# Patient Record
Sex: Male | Born: 1965 | Race: White | Hispanic: No | Marital: Single | Smoking: Never smoker
Health system: Southern US, Community
[De-identification: ages and names within clinical notes are randomized; demographics above are authoritative.]

## PROBLEM LIST (undated history)

## (undated) DIAGNOSIS — R42 Dizziness and giddiness: Secondary | ICD-10-CM

## (undated) HISTORY — DX: Dizziness and giddiness: R42

---

## 2002-03-21 ENCOUNTER — Emergency Department (HOSPITAL_COMMUNITY): Admission: EM | Admit: 2002-03-21 | Discharge: 2002-03-21 | Payer: Self-pay | Admitting: *Deleted

## 2003-09-14 ENCOUNTER — Emergency Department (HOSPITAL_COMMUNITY): Admission: EM | Admit: 2003-09-14 | Discharge: 2003-09-14 | Payer: Self-pay | Admitting: *Deleted

## 2003-12-21 ENCOUNTER — Emergency Department (HOSPITAL_COMMUNITY): Admission: EM | Admit: 2003-12-21 | Discharge: 2003-12-21 | Payer: Self-pay | Admitting: Emergency Medicine

## 2005-03-19 ENCOUNTER — Emergency Department (HOSPITAL_COMMUNITY): Admission: EM | Admit: 2005-03-19 | Discharge: 2005-03-19 | Payer: Self-pay | Admitting: Emergency Medicine

## 2012-08-28 ENCOUNTER — Emergency Department (HOSPITAL_COMMUNITY): Payer: Self-pay

## 2012-08-28 ENCOUNTER — Emergency Department (HOSPITAL_COMMUNITY)
Admission: EM | Admit: 2012-08-28 | Discharge: 2012-08-28 | Disposition: A | Discharge status: Home / Self Care | Payer: Self-pay | Attending: Emergency Medicine | Admitting: Emergency Medicine

## 2012-08-28 ENCOUNTER — Encounter (HOSPITAL_COMMUNITY): Payer: Self-pay | Admitting: *Deleted

## 2012-08-28 DIAGNOSIS — R079 Chest pain, unspecified: Secondary | ICD-10-CM | POA: Insufficient documentation

## 2012-08-28 LAB — BASIC METABOLIC PANEL WITH GFR
BUN: 10 mg/dL (ref 6–23)
CO2: 29 meq/L (ref 19–32)
Calcium: 9.7 mg/dL (ref 8.4–10.5)
Chloride: 101 meq/L (ref 96–112)
Creatinine, Ser: 0.81 mg/dL (ref 0.50–1.35)
GFR calc Af Amer: >90 mL/min
GFR calc non Af Amer: >90 mL/min
Glucose, Bld: 87 mg/dL (ref 70–99)
Potassium: 4 meq/L (ref 3.5–5.1)
Sodium: 139 meq/L (ref 135–145)

## 2012-08-28 LAB — TROPONIN I
Troponin I: <0.3 ng/mL
Troponin I: <0.3 ng/mL

## 2012-08-28 LAB — CBC
HCT: 42.6 % (ref 39.0–52.0)
Hemoglobin: 15.4 g/dL (ref 13.0–17.0)
MCH: 31.3 pg (ref 26.0–34.0)
MCHC: 36.2 g/dL — ABNORMAL HIGH (ref 30.0–36.0)
MCV: 86.6 fL (ref 78.0–100.0)
Platelets: 190 K/uL (ref 150–400)
RBC: 4.92 MIL/uL (ref 4.22–5.81)
RDW: 12.4 % (ref 11.5–15.5)
WBC: 7.2 K/uL (ref 4.0–10.5)

## 2012-08-28 MED ORDER — ASPIRIN 81 MG PO CHEW
324.0000 mg | CHEWABLE_TABLET | Freq: Once | ORAL | Status: AC
  Administered 2012-08-28: 324 mg via ORAL
  Filled 2012-08-28: qty 4

## 2012-08-28 NOTE — ED Notes (Signed)
Chest pain for 2 hours, "light headed"

## 2012-08-28 NOTE — ED Notes (Signed)
Patient with no complaints at this time. Respirations even and unlabored. Skin warm/dry. Discharge instructions reviewed with patient at this time. Patient given opportunity to voice concerns/ask questions. IV removed per policy and band-aid applied to site. Patient discharged at this time and left Emergency Department with steady gait.  

## 2012-08-28 NOTE — ED Provider Notes (Signed)
History     CSN: [id number]  Arrival date & time 08/28/12  1353   First MD Initiated Contact with Patient 08/28/12 1629      Chief Complaint  Patient presents with  . 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 States he had SOB at the time, pain was non-radiating and gradually improving. No pain at present. He denies any prior history of same. No known CAD, HTN, DM or HLD.  History reviewed. No pertinent past medical history.  History reviewed. No pertinent past surgical history.  History reviewed. No pertinent family history.  History  Substance Use Topics  . Smoking status: Never Smoker   . Smokeless tobacco: Not on file  . Alcohol Use: No      Review of Systems All other systems reviewed and are negative except as noted in HPI.   Allergies  Haldol  Home Medications  No current outpatient prescriptions on file.  BP 123/83  Pulse 76  Temp 97.2 F (36.2 C) (Oral)  Resp 20  Ht 6\' 2"  (1.88 m)  Wt 205 lb (92.987 kg)  BMI 26.32 kg/m2  SpO2 100%  Physical Exam  Nursing note and vitals reviewed. Constitutional: He is oriented to person, place, and time. He appears well-developed and well-nourished.  HENT:  Head: Normocephalic and atraumatic.  Eyes: EOM are normal. Pupils are equal, round, and reactive to light.  Neck: Normal range of motion. Neck supple.  Cardiovascular: Normal rate, normal heart sounds and intact distal pulses.   Pulmonary/Chest: Effort normal and breath sounds normal.  Abdominal: Bowel sounds are normal. He exhibits no distension. There is no tenderness.  Musculoskeletal: Normal range of motion. He exhibits no edema and no tenderness.  Neurological: He is alert and oriented to person, place, and time. He has normal strength. No cranial nerve deficit or sensory deficit.  Skin:  Skin is warm and dry. No rash noted.  Psychiatric: He has a normal mood and affect.    ED Course  Procedures (including critical care time)  Labs Reviewed  CBC - Abnormal; Notable for the following:    MCHC 36.2 (*)     All other components within normal limits  BASIC METABOLIC PANEL  TROPONIN I  TROPONIN I   Dg Chest 2 View  08/28/2012  *RADIOLOGY REPORT*  Clinical Data: Chest pain  CHEST - 2 VIEW  Comparison: None.  Findings: The lungs are clear.  Mediastinal contours appear normal. The heart is within normal limits in size.  No bony abnormality is seen.  IMPRESSION: No active lung disease.   Original Report Authenticated By: Juline Patch, M.D.      No diagnosis found.    MDM   Date: 08/28/2012  Rate: 87  Rhythm: normal sinus rhythm  QRS Axis: normal  Intervals: normal  ST/T Wave abnormalities: normal  Conduction Disutrbances: none  Narrative Interpretation: unremarkable  6:28 PM] Pt remains pain free. Labs EKG, and imaging, including Trop x 2 neg. Pt is low risk for ACS or CAD. Do not feel admission is indicated. Pt advised to followup with PCP for recheck and to return to the ED for any worsening or worrisome symptoms.          Charles B. Bernette Mayers, MD 08/28/12 [place]

## 2020-03-05 ENCOUNTER — Emergency Department (HOSPITAL_COMMUNITY): Payer: Medicaid Other

## 2020-03-05 ENCOUNTER — Other Ambulatory Visit: Payer: Self-pay

## 2020-03-05 ENCOUNTER — Observation Stay (HOSPITAL_COMMUNITY)
Admission: EM | Admit: 2020-03-05 | Discharge: 2020-03-06 | Disposition: A | Discharge status: Home / Self Care | Payer: Medicaid Other | Attending: Internal Medicine | Admitting: Internal Medicine

## 2020-03-05 ENCOUNTER — Encounter (HOSPITAL_COMMUNITY): Payer: Self-pay

## 2020-03-05 DIAGNOSIS — Z79899 Other long term (current) drug therapy: Secondary | ICD-10-CM | POA: Diagnosis not present

## 2020-03-05 DIAGNOSIS — R29818 Other symptoms and signs involving the nervous system: Secondary | ICD-10-CM | POA: Diagnosis not present

## 2020-03-05 DIAGNOSIS — H538 Other visual disturbances: Secondary | ICD-10-CM | POA: Diagnosis not present

## 2020-03-05 DIAGNOSIS — R200 Anesthesia of skin: Principal | ICD-10-CM | POA: Diagnosis present

## 2020-03-05 DIAGNOSIS — Z03818 Encounter for observation for suspected exposure to other biological agents ruled out: Secondary | ICD-10-CM | POA: Diagnosis not present

## 2020-03-05 DIAGNOSIS — I6340 Cerebral infarction due to embolism of unspecified cerebral artery: Secondary | ICD-10-CM | POA: Insufficient documentation

## 2020-03-05 DIAGNOSIS — I619 Nontraumatic intracerebral hemorrhage, unspecified: Secondary | ICD-10-CM | POA: Insufficient documentation

## 2020-03-05 DIAGNOSIS — I609 Nontraumatic subarachnoid hemorrhage, unspecified: Secondary | ICD-10-CM

## 2020-03-05 DIAGNOSIS — Z20822 Contact with and (suspected) exposure to covid-19: Secondary | ICD-10-CM | POA: Insufficient documentation

## 2020-03-05 DIAGNOSIS — R202 Paresthesia of skin: Secondary | ICD-10-CM | POA: Diagnosis not present

## 2020-03-05 DIAGNOSIS — F17290 Nicotine dependence, other tobacco product, uncomplicated: Secondary | ICD-10-CM | POA: Insufficient documentation

## 2020-03-05 DIAGNOSIS — G5621 Lesion of ulnar nerve, right upper limb: Secondary | ICD-10-CM | POA: Insufficient documentation

## 2020-03-05 DIAGNOSIS — I6330 Cerebral infarction due to thrombosis of unspecified cerebral artery: Secondary | ICD-10-CM | POA: Insufficient documentation

## 2020-03-05 DIAGNOSIS — M50222 Other cervical disc displacement at C5-C6 level: Secondary | ICD-10-CM | POA: Diagnosis not present

## 2020-03-05 LAB — URINALYSIS, ROUTINE W REFLEX MICROSCOPIC
Bilirubin Urine: NEGATIVE
Glucose, UA: NEGATIVE mg/dL
Hgb urine dipstick: NEGATIVE
Ketones, ur: NEGATIVE mg/dL
Leukocytes,Ua: NEGATIVE
Nitrite: NEGATIVE
Protein, ur: NEGATIVE mg/dL
Specific Gravity, Urine: 1.017 (ref 1.005–1.030)
pH: 6 (ref 5.0–8.0)

## 2020-03-05 LAB — CBC
HCT: 44.5 % (ref 39.0–52.0)
Hemoglobin: 15.8 g/dL (ref 13.0–17.0)
MCH: 31.2 pg (ref 26.0–34.0)
MCHC: 35.5 g/dL (ref 30.0–36.0)
MCV: 87.9 fL (ref 80.0–100.0)
Platelets: 207 10*3/uL (ref 150–400)
RBC: 5.06 MIL/uL (ref 4.22–5.81)
RDW: 12.3 % (ref 11.5–15.5)
WBC: 5.9 10*3/uL (ref 4.0–10.5)
nRBC: 0 % (ref 0.0–0.2)

## 2020-03-05 LAB — COMPREHENSIVE METABOLIC PANEL
ALT: 28 U/L (ref 0–44)
AST: 22 U/L (ref 15–41)
Albumin: 4.2 g/dL (ref 3.5–5.0)
Alkaline Phosphatase: 79 U/L (ref 38–126)
Anion gap: 8 (ref 5–15)
BUN: 14 mg/dL (ref 6–20)
CO2: 27 mmol/L (ref 22–32)
Calcium: 9 mg/dL (ref 8.9–10.3)
Chloride: 104 mmol/L (ref 98–111)
Creatinine, Ser: 0.94 mg/dL (ref 0.61–1.24)
GFR calc Af Amer: >60 mL/min (ref >60)
GFR calc non Af Amer: >60 mL/min (ref >60)
Glucose, Bld: 103 mg/dL — ABNORMAL HIGH (ref 70–99)
Potassium: 3.4 mmol/L — ABNORMAL LOW (ref 3.5–5.1)
Sodium: 139 mmol/L (ref 135–145)
Total Bilirubin: 0.7 mg/dL (ref 0.3–1.2)
Total Protein: 7.2 g/dL (ref 6.5–8.1)

## 2020-03-05 LAB — RAPID URINE DRUG SCREEN, HOSP PERFORMED
Amphetamines: NOT DETECTED
Barbiturates: NOT DETECTED
Benzodiazepines: NOT DETECTED
Cocaine: NOT DETECTED
Opiates: NOT DETECTED
Tetrahydrocannabinol: NOT DETECTED

## 2020-03-05 LAB — DIFFERENTIAL
Abs Immature Granulocytes: 0.01 10*3/uL (ref 0.00–0.07)
Basophils Absolute: 0 10*3/uL (ref 0.0–0.1)
Basophils Relative: 1 %
Eosinophils Absolute: 0.1 10*3/uL (ref 0.0–0.5)
Eosinophils Relative: 2 %
Immature Granulocytes: 0 %
Lymphocytes Relative: 35 %
Lymphs Abs: 2.1 10*3/uL (ref 0.7–4.0)
Monocytes Absolute: 0.4 10*3/uL (ref 0.1–1.0)
Monocytes Relative: 7 %
Neutro Abs: 3.4 10*3/uL (ref 1.7–7.7)
Neutrophils Relative %: 55 %

## 2020-03-05 LAB — PROTIME-INR
INR: 1 (ref 0.8–1.2)
Prothrombin Time: 12.8 seconds (ref 11.4–15.2)

## 2020-03-05 LAB — I-STAT CHEM 8, ED
BUN: 13 mg/dL (ref 6–20)
Calcium, Ion: 1.21 mmol/L (ref 1.15–1.40)
Chloride: 103 mmol/L (ref 98–111)
Creatinine, Ser: 0.9 mg/dL (ref 0.61–1.24)
Glucose, Bld: 103 mg/dL — ABNORMAL HIGH (ref 70–99)
HCT: 44 % (ref 39.0–52.0)
Hemoglobin: 15 g/dL (ref 13.0–17.0)
Potassium: 3.5 mmol/L (ref 3.5–5.1)
Sodium: 141 mmol/L (ref 135–145)
TCO2: 30 mmol/L (ref 22–32)

## 2020-03-05 LAB — ETHANOL: Alcohol, Ethyl (B): <10 mg/dL (ref <10)

## 2020-03-05 LAB — APTT: aPTT: 30 seconds (ref 24–36)

## 2020-03-05 MED ORDER — ATORVASTATIN CALCIUM 40 MG PO TABS
40.0000 mg | ORAL_TABLET | Freq: Every day | ORAL | Status: DC
  Administered 2020-03-05: 40 mg via ORAL
  Filled 2020-03-05: qty 1

## 2020-03-05 MED ORDER — ASPIRIN 325 MG PO TABS
325.0000 mg | ORAL_TABLET | Freq: Once | ORAL | Status: AC
  Administered 2020-03-05: 325 mg via ORAL
  Filled 2020-03-05: qty 1

## 2020-03-05 MED ORDER — IOHEXOL 350 MG/ML SOLN
75.0000 mL | Freq: Once | INTRAVENOUS | Status: AC | PRN
  Administered 2020-03-05: 75 mL via INTRAVENOUS

## 2020-03-05 NOTE — ED Provider Notes (Signed)
I accepted this patient at change of shift, he has focal unilateral numbness, discussed with neurology who recommends that the patient be transferred to the stroke center as there is no MRI here today or tomorrow.  Needs the rest of the stroke work-up, thankfully CT angiogram negative  Discussed with the hospitalist who is agreeable to the plan   Noemi Chapel, MD 03/05/20 1807

## 2020-03-05 NOTE — H&P (Signed)
History and Physical    Thomas Gutierrez [id number] DOB: 1966-03-04 DOA: 03/05/2020  PCP: Patient, No Pcp Per   Patient coming from: Home  I have personally briefly reviewed patient's old medical records in [place]  Chief Complaint: Right upper extremity numbness  HPI: Thomas Gutierrez is a 54 y.o. male with no known significant medical history.  Presented to the ED reports of sudden onset of abnormal sensation involving his right hand, especially his medial 3 fingers, moving upwards and subsequently involving his right upper extremity.  He describes the sensation as an electric shock, tingling, but he still able to to feel touch.  Denies any weakness of his extremities.  No facial asymmetry, no slurred speech. He uses smokeless tobacco snuff, never smoked cigarettes.  Occasionally uses marijuana. He denies history of neck pain.  He is not on any medications.  Numbness of his right upper extremity is persistent.  Reports occasional vision changes-blurriness, intermittent over the past 2 weeks, lasting about 3 seconds.  ED Course: Stable vitals.  EKG shows sinus rhythm.  Head CT, CTA head and neck negative for acute abnormality, no large vessel occlusion, no hemodynamically significant stenosis or dissection.  Cervical spine CT also unremarkable.  Telemetry neurology consulted recommended stroke work-up.  MRI not available at [name], EDP talked to [name], on-call for neurology at [facility], okay with transfer to complete stroke work-up.  Review of Systems: As per HPI all other systems reviewed and negative.  History reviewed. No pertinent past medical history.  History reviewed. No pertinent surgical history.   reports that he has never smoked. His smokeless tobacco use includes snuff. He reports that he does not drink alcohol or use drugs.  Allergies  Allergen Reactions  . Haldol [Haloperidol Lactate] Other (See Comments)    REACTION: Muscle tightness (tension) all over  body for 6 hours.     Family History  Problem Relation Age of Onset  . Cancer Mother   . Cancer Father   . Cancer Sister     Prior to Admission medications   Medication Sig Start Date End Date Taking? Authorizing Provider  Aspirin-Salicylamide-Caffeine (BC HEADACHE POWDER PO) Take 1 packet by mouth as needed (will take 1 BC powder if Advil dosent work for headache. usually takes 1 BC powder twice weekly.).   Yes [provider]  Ibuprofen (ADVIL LIQUI-GELS MINIS PO) Take 4 capsules by mouth as needed (for headache).   Yes [provider]    Physical Exam: Vitals:   03/05/20 1214 03/05/20 1215 03/05/20 1218 03/05/20 1220  BP:  (!) 136/95    Pulse: 86   81  Resp: 19   15  Temp:  98 F (36.7 C)    TempSrc:  Oral    SpO2: 96%   98%  Weight:      Height:   6\' 2"  (1.88 m)     Constitutional: NAD, calm, comfortable. Vitals:   03/05/20 1214 03/05/20 1215 03/05/20 1218 03/05/20 1220  BP:  (!) 136/95    Pulse: 86   81  Resp: 19   15  Temp:  98 F (36.7 C)    TempSrc:  Oral    SpO2: 96%   98%  Weight:      Height:   6\' 2"  (1.88 m)    Eyes: PERRL, lids and conjunctivae normal ENMT: Mucous membranes are moist.  Neck: normal, supple, no masses, no thyromegaly Respiratory:  Normal respiratory effort. No accessory muscle use.  Cardiovascular: Regular rate and rhythm, No extremity edema. 2+ pedal pulses.   Abdomen: no tenderness, no masses palpated. No hepatosplenomegaly. Bowel sounds positive.  Musculoskeletal: no clubbing / cyanosis. No joint deformity upper and lower extremities. Good ROM, no contractures. Normal muscle tone.  Skin: no rashes, lesions, ulcers. No induration Neurologic: CN 2-12 grossly intact. Sensation intact globally including right upper extremity, DTR normal. Strength 5/5 in all 4.  Psychiatric: Normal judgment and insight. Alert and oriented x 3. Normal mood.   Labs on Admission: I have personally reviewed following labs and imaging  studies  CBC: Recent Labs  Lab 03/05/20 1152 03/05/20 1201  WBC 5.9  --   NEUTROABS 3.4  --   HGB 15.8 15.0  HCT 44.5 44.0  MCV 87.9  --   PLT 207  --    Basic Metabolic Panel: Recent Labs  Lab 03/05/20 1152 03/05/20 1201  NA 139 141  K 3.4* 3.5  CL 104 103  CO2 27  --   GLUCOSE 103* 103*  BUN 14 13  CREATININE 0.94 0.90  CALCIUM 9.0  --    Liver Function Tests: Recent Labs  Lab 03/05/20 1152  AST 22  ALT 28  ALKPHOS 79  BILITOT 0.7  PROT 7.2  ALBUMIN 4.2   Coagulation Profile: Recent Labs  Lab 03/05/20 1152  INR 1.0   Urine analysis:    Component Value Date/Time   COLORURINE YELLOW 03/05/2020 1346   APPEARANCEUR CLEAR 03/05/2020 1346   LABSPEC 1.017 03/05/2020 1346   PHURINE 6.0 03/05/2020 1346   GLUCOSEU NEGATIVE 03/05/2020 1346   HGBUR NEGATIVE 03/05/2020 1346   BILIRUBINUR NEGATIVE 03/05/2020 1346   [place] 03/05/2020 1346   PROTEINUR NEGATIVE 03/05/2020 1346   NITRITE NEGATIVE 03/05/2020 1346   LEUKOCYTESUR NEGATIVE 03/05/2020 1346    Radiological Exams on Admission: CT Angio Head W or Wo Contrast  Result Date: 03/05/2020 CLINICAL DATA:  Right arm numbness, code stroke follow-up EXAM: CT ANGIOGRAPHY HEAD AND NECK TECHNIQUE: Multidetector CT imaging of the head and neck was performed using the standard protocol during bolus administration of intravenous contrast. Multiplanar CT image reconstructions and MIPs were obtained to evaluate the vascular anatomy. Carotid stenosis measurements (when applicable) are obtained utilizing NASCET criteria, using the distal internal carotid diameter as the denominator. CONTRAST:  33mL OMNIPAQUE IOHEXOL 350 MG/ML SOLN COMPARISON:  None. FINDINGS: CT HEAD FINDINGS Brain: There is no acute intracranial hemorrhage, mass effect, or edema. There is no new loss of gray-white differentiation to suggest evolving recent infarction. Ventricles and sulci are normal in size and configuration. Vascular: No new  finding. Skull: Unremarkable. Sinuses: No new finding. Orbits: No new finding. Review of the MIP images confirms the above findings CTA NECK FINDINGS Aortic arch: Great vessel origins are patent. Right carotid system: Patent. No measurable stenosis or evidence of dissection. Left carotid system: Patent. No measurable stenosis or evidence of dissection. Vertebral arteries: Patent. Left vertebral artery is slightly dominant. No measurable stenosis or evidence of dissection. Skeleton: No significant abnormality. Other neck: No mass or adenopathy. Upper chest: No apical lung mass Review of the MIP images confirms the above findings CTA HEAD FINDINGS Anterior circulation: Intracranial internal carotid arteries are patent. Anterior and middle cerebral arteries are patent. Posterior circulation: Intracranial vertebral arteries, basilar artery, and posterior cerebral arteries are patent. Venous sinuses: As permitted by contrast timing, patent. Review of the MIP images confirms the above findings IMPRESSION: No acute intracranial hemorrhage or new loss of gray-white differentiation to suggest  evolving infarction. No large vessel occlusion, hemodynamically significant stenosis, or evidence of dissection. Electronically Signed   By: Macy Mis M.D.   On: 03/05/2020 16:56   CT Angio Neck W and/or Wo Contrast  Result Date: 03/05/2020 CLINICAL DATA:  Right arm numbness, code stroke follow-up EXAM: CT ANGIOGRAPHY HEAD AND NECK TECHNIQUE: Multidetector CT imaging of the head and neck was performed using the standard protocol during bolus administration of intravenous contrast. Multiplanar CT image reconstructions and MIPs were obtained to evaluate the vascular anatomy. Carotid stenosis measurements (when applicable) are obtained utilizing NASCET criteria, using the distal internal carotid diameter as the denominator. CONTRAST:  7mL OMNIPAQUE IOHEXOL 350 MG/ML SOLN COMPARISON:  None. FINDINGS: CT HEAD FINDINGS Brain: There  is no acute intracranial hemorrhage, mass effect, or edema. There is no new loss of gray-white differentiation to suggest evolving recent infarction. Ventricles and sulci are normal in size and configuration. Vascular: No new finding. Skull: Unremarkable. Sinuses: No new finding. Orbits: No new finding. Review of the MIP images confirms the above findings CTA NECK FINDINGS Aortic arch: Great vessel origins are patent. Right carotid system: Patent. No measurable stenosis or evidence of dissection. Left carotid system: Patent. No measurable stenosis or evidence of dissection. Vertebral arteries: Patent. Left vertebral artery is slightly dominant. No measurable stenosis or evidence of dissection. Skeleton: No significant abnormality. Other neck: No mass or adenopathy. Upper chest: No apical lung mass Review of the MIP images confirms the above findings CTA HEAD FINDINGS Anterior circulation: Intracranial internal carotid arteries are patent. Anterior and middle cerebral arteries are patent. Posterior circulation: Intracranial vertebral arteries, basilar artery, and posterior cerebral arteries are patent. Venous sinuses: As permitted by contrast timing, patent. Review of the MIP images confirms the above findings IMPRESSION: No acute intracranial hemorrhage or new loss of gray-white differentiation to suggest evolving infarction. No large vessel occlusion, hemodynamically significant stenosis, or evidence of dissection. Electronically Signed   By: Macy Mis M.D.   On: 03/05/2020 16:56   CT Cervical Spine Wo Contrast  Result Date: 03/05/2020 CLINICAL DATA:  Neuro deficit, acute, stroke suspected. Numbness/weakness right mid humerus distally into digits 3, 4, 5, last known normal questionable 8am. EXAM: CT HEAD WITHOUT CONTRAST CT CERVICAL SPINE WITHOUT CONTRAST TECHNIQUE: Multidetector CT imaging of the head and cervical spine was performed following the standard protocol without intravenous contrast.  Multiplanar CT image reconstructions of the cervical spine were also generated. COMPARISON:  No pertinent prior studies available for comparison. FINDINGS: CT HEAD FINDINGS Brain: There is no evidence of acute intracranial hemorrhage, intracranial mass, midline shift or extra-axial fluid collection.No demarcated cortical infarction. Minimal ill-defined hypoattenuation within the cerebral white matter is nonspecific, but consistent with chronic small vessel ischemic disease. Cerebral volume is normal for age. Vascular: No hyperdense vessel. Skull: Normal. Negative for fracture or focal lesion. Sinuses/Orbits: Visualized orbits demonstrate no acute abnormality. No significant paranasal sinus disease or mastoid effusion at the imaged levels. CT CERVICAL SPINE FINDINGS Alignment: Reversal of the expected cervical lordosis. No significant spondylolisthesis. Skull base and vertebrae: The basion-dental and atlanto-dental intervals are maintained.No evidence of acute fracture to the cervical spine. Soft tissues and spinal canal: Unremarkable Disc levels: Mild multilevel disc space narrowing. There are small multilevel shallow disc bulges. No more than mild apparent spinal canal stenosis at any level. No significant bony neural foraminal narrowing within the cervical spine. Upper chest: No consolidation within the imaged lung apices. CT head results were called by telephone at the time of interpretation on  03/05/2020 at 12:22 pm to provider [facility] , who verbally acknowledged these results. IMPRESSION: CT head: 1. No evidence of acute intracranial abnormality. 2. Minimal chronic small vessel ischemic disease. CT cervical spine 1. Cervical spondylosis as outlined with multilevel shallow disc bulges. No more than mild spinal canal stenosis. No significant bony neural foraminal narrowing. 2. No evidence of acute fracture to the cervical spine. Electronically Signed   By: Kellie Simmering DO   On: 03/05/2020 12:26   CT HEAD CODE  STROKE WO CONTRAST  Result Date: 03/05/2020 CLINICAL DATA:  Neuro deficit, acute, stroke suspected. Numbness/weakness right mid humerus distally into digits 3, 4, 5, last known normal questionable 8am. EXAM: CT HEAD WITHOUT CONTRAST CT CERVICAL SPINE WITHOUT CONTRAST TECHNIQUE: Multidetector CT imaging of the head and cervical spine was performed following the standard protocol without intravenous contrast. Multiplanar CT image reconstructions of the cervical spine were also generated. COMPARISON:  No pertinent prior studies available for comparison. FINDINGS: CT HEAD FINDINGS Brain: There is no evidence of acute intracranial hemorrhage, intracranial mass, midline shift or extra-axial fluid collection.No demarcated cortical infarction. Minimal ill-defined hypoattenuation within the cerebral white matter is nonspecific, but consistent with chronic small vessel ischemic disease. Cerebral volume is normal for age. Vascular: No hyperdense vessel. Skull: Normal. Negative for fracture or focal lesion. Sinuses/Orbits: Visualized orbits demonstrate no acute abnormality. No significant paranasal sinus disease or mastoid effusion at the imaged levels. CT CERVICAL SPINE FINDINGS Alignment: Reversal of the expected cervical lordosis. No significant spondylolisthesis. Skull base and vertebrae: The basion-dental and atlanto-dental intervals are maintained.No evidence of acute fracture to the cervical spine. Soft tissues and spinal canal: Unremarkable Disc levels: Mild multilevel disc space narrowing. There are small multilevel shallow disc bulges. No more than mild apparent spinal canal stenosis at any level. No significant bony neural foraminal narrowing within the cervical spine. Upper chest: No consolidation within the imaged lung apices. CT head results were called by telephone at the time of interpretation on 03/05/2020 at 12:22 pm to provider [facility] , who verbally acknowledged these results. IMPRESSION: CT head: 1. No  evidence of acute intracranial abnormality. 2. Minimal chronic small vessel ischemic disease. CT cervical spine 1. Cervical spondylosis as outlined with multilevel shallow disc bulges. No more than mild spinal canal stenosis. No significant bony neural foraminal narrowing. 2. No evidence of acute fracture to the cervical spine. Electronically Signed   By: Kellie Simmering DO   On: 03/05/2020 12:26    EKG: Independently reviewed.  Sinus rhythm, QTC 430.  No significant ST or T wave changes compared to prior.  Assessment/Plan Active Problems:   Right upper extremity numbness   Right upper extremity numbness-persistent.  CTA head and neck, CT head unremarkable, no large vessel occlusion.  Uses smokeless tobacco snuff, denies cigarettes smoking, no other appreciable risk factors.  No other focal neurologic deficits.  Denies neck pain.  Differentials include stroke versus cervical neuropathy. -MRI brain stroke work-up -MRI cervical spine MRI cervical spine -Aspirin 325x1 continue 81 mg daily -Atorvastatin 40 mg daily -Lipid panel, Hgba1c in a.m. -Carotid Dopplers deferred, CTA head and neck done -Echocardiogram   DVT prophylaxis: Lovenox Code Status: Full code Family Communication: Significant other at bedside Disposition Plan: 1 to 2 days Consults called: None Admission status: Observation, telemetry  Bethena Roys MD [facility]  03/05/2020, 6:31 PM

## 2020-03-05 NOTE — Progress Notes (Signed)
[facility] Tele[facility]urology Consult Services   Date of Service:   03/05/2020 12:01:12  Impression:     .  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 Cannot exclude neuropathy vs radiculopathy vs small stroke. Additional imaging is warranted, MRI brain, MRI CSP  Metrics: Last Known Well: 03/05/2020 00:00:00 [facility] Notification Time: 03/05/2020 12:01:12 Arrival Time: 03/05/2020 11:33:00 Stamp Time: 03/05/2020 12:01:12 Time First Login Attempt: 03/05/2020 12:07:29 Symptoms: numbness right ring finger and extended to right arm NIHSS Start Assessment Time: 03/05/2020 12:08:56 Patient is not a candidate for Alteplase/Activase. Alteplase Medical Decision: 03/05/2020 12:08:48 Patient was not deemed candidate for Alteplase/Activase thrombolytics because of Last Well Known Above 4.5 Hours.  CT head showed no acute hemorrhage or acute core infarct. CT head was reviewed.  Clinical Presentation is not Suggestive of Large Vessel Occlusive Disease  ED Physician notified of diagnostic impression and management plan on 03/05/2020 12:17:47  Our recommendations are outlined below.  Recommendations:     .  Activate Stroke Protocol Admission/Order Set     .  Stroke/Telemetry Floor     .  Neuro Checks     .  Bedside Swallow Eval     .  DVT Prophylaxis     .  IV Fluids, Normal Saline     .  Head of Bed 30 Degrees     .  Euglycemia and Avoid Hyperthermia (PRN Acetaminophen)     .  Initiate Aspirin 81 MG Daily  Routine Consultation with [facility] for Follow up Care  Sign Out:     .  Discussed with Emergency Department Provider    ------------------------------------------------------------------------------  History of Present Illness: Patient is a 54 year old  Male.  Patient was brought by private transportation with symptoms of numbness right ring finger and extended to right arm  Patient is a 54 yr old gentleman with no reported past medical history who presents with tingling in the right ring finger noted on awakening at 7:30 or 8am. He was well when he went to bed at midnight. It progressed to involve all his fingers and now his arm to above the elbow. No other sx, no weakness, no dizziness, no recent trauma or injury. He does report mild suboccipital headache as well. Patient has been having fluttering in his chest over the past year but has not been to a physician nonsmoker, nondrinker, no exposure to covid  Last seen normal was beyond 4.5 hours of presentation. There is no history of hemorrhagic complications or intracranial hemorrhage. There is no history of Recent Anticoagulants. There is no history of recent major surgery. There is no history of recent stroke.  Past Medical History:     . There is NO history of Hypertension     . There is NO history of Diabetes Mellitus     . There is NO history of Hyperlipidemia     . There is NO history of Atrial Fibrillation     . There is NO history of Coronary Artery Disease     . There is NO history of Stroke  Anticoagulant use:  No  Antiplatelet use: No    Examination: BP(147/98), Pulse(84), Blood Glucose(103) 1A: Level of Consciousness - Alert; keenly responsive + 0 1B: Ask Month and Age - Both Questions Right + 0 1C: Blink Eyes & Squeeze Hands - Performs Both  Tasks + 0 2: Test Horizontal Extraocular Movements - Normal + 0 3: Test Visual Fields - No Visual Loss + 0 4: Test Facial Palsy (Use Grimace if Obtunded) - Normal symmetry + 0 5A: Test Left Arm Motor Drift - No Drift for 10 Seconds + 0 5B: Test Right Arm Motor Drift - No Drift for 10 Seconds + 0 6A: Test Left Leg Motor Drift - No Drift for 5 Seconds + 0 6B: Test Right Leg Motor Drift - No Drift for 5 Seconds + 0 7: Test Limb  Ataxia (FNF/Heel-Shin) - No Ataxia + 0 8: Test Sensation - Normal; No sensory loss + 0 9: Test Language/Aphasia - Normal; No aphasia + 0 10: Test Dysarthria - Normal + 0 11: Test Extinction/Inattention - No abnormality + 0  NIHSS Score: 0  Pre-Morbid Modified Ranking Scale: 0 Points = No symptoms at all   Patient/Family was informed the Neurology Consult would occur via TeleHealth consult by way of interactive audio and video telecommunications and consented to receiving care in this manner.   Due to the immediate potential for life-threatening deterioration due to underlying acute neurologic illness, I spent 20 minutes providing critical care. This time includes time for face to face visit via telemedicine, review of medical records, imaging studies and discussion of findings with providers, the patient and/or family.   Dr Jacqulynn Cadet Beola Vasallo   [facility] [phone]  Case [id number]

## 2020-03-05 NOTE — ED Triage Notes (Signed)
Pt reports went to bed around midnight last night.  Woke up around 0730 and when he got up to the bathroom at 0800 he noticed his r ring finger felt numb.  Reports since then the numbness has spread to hand and up r arm.  Reports has frequently noticed a fluttering in chest.

## 2020-03-05 NOTE — ED Provider Notes (Addendum)
[facility] EMERGENCY DEPARTMENT Provider Note   CSN: [id number] Arrival date & time: 03/05/20  1133  An emergency department physician performed an initial assessment on this suspected stroke patient at 34.  History Chief Complaint  Patient presents with  . numbness in r arm    Thomas Gutierrez is a 54 y.o. male.  Level 5 caveat for acuity of condition.  Chief complaint right arm numbness from mid humerus distally to digits 3, 4, 5.  Last normal was ?  0800.  No facial asymmetry, visual changes, other extremity deficits, confusion, fever, sweats, chills.  Review of systems positive for chronic intermittent neck pain.        History reviewed. No pertinent past medical history.  There are no problems to display for this patient.   History reviewed. No pertinent surgical history.     Family History  Problem Relation Age of Onset  . Cancer Mother   . Cancer Father   . Cancer Sister     Social History   Tobacco Use  . Smoking status: Never Smoker  . Smokeless tobacco: Current User    Types: Snuff  Substance Use Topics  . Alcohol use: No  . Drug use: No    Home Medications Prior to Admission medications   Medication Sig Start Date End Date Taking? Authorizing Provider  Aspirin-Salicylamide-Caffeine (BC HEADACHE POWDER PO) Take 1 packet by mouth as needed (will take 1 BC powder if Advil dosent work for headache. usually takes 1 BC powder twice weekly.).   Yes [provider]  Ibuprofen (ADVIL LIQUI-GELS MINIS PO) Take 4 capsules by mouth as needed (for headache).   Yes [provider]    Allergies    Haldol [haloperidol lactate]  Review of Systems   Review of Systems  Unable to perform ROS: Acuity of condition    Physical Exam Updated Vital Signs BP (!) 136/95   Pulse 81   Temp 98 F (36.7 C) (Oral)   Resp 15   Ht 6\' 2"  (1.88 m)   Wt 102.6 kg   SpO2 98%   BMI 29.06 kg/m   Physical Exam Vitals and nursing note reviewed.    Constitutional:      Appearance: He is well-developed.  HENT:     Head: Normocephalic and atraumatic.  Eyes:     Conjunctiva/sclera: Conjunctivae normal.  Cardiovascular:     Rate and Rhythm: Normal rate and regular rhythm.  Pulmonary:     Effort: Pulmonary effort is normal.     Breath sounds: Normal breath sounds.  Abdominal:     General: Bowel sounds are normal.     Palpations: Abdomen is soft.  Musculoskeletal:        General: Normal range of motion.     Cervical back: Neck supple.  Skin:    General: Skin is warm and dry.  Neurological:     General: No focal deficit present.     Mental Status: He is alert and oriented to person, place, and time.     Comments: Right upper extremity: Full range of motion, numbness and tingling from mid humerus distally to digits 3, 4, and 5  Psychiatric:        Behavior: Behavior normal.     ED Results / Procedures / Treatments   Labs (all labs ordered are listed, but only abnormal results are displayed) Labs Reviewed  COMPREHENSIVE METABOLIC PANEL - Abnormal; Notable for the following components:      Result Value  Potassium 3.4 (*)    Glucose, Bld 103 (*)    All other components within normal limits  I-STAT CHEM 8, ED - Abnormal; Notable for the following components:   Glucose, Bld 103 (*)    All other components within normal limits  ETHANOL  PROTIME-INR  APTT  CBC  DIFFERENTIAL  RAPID URINE DRUG SCREEN, HOSP PERFORMED  URINALYSIS, ROUTINE W REFLEX MICROSCOPIC    EKG EKG Interpretation  Date/Time:  Wednesday March 05 2020 12:18:44 EDT Ventricular Rate:  81 PR Interval:    QRS Duration: 102 QT Interval:  370 QTC Calculation: 430 R Axis:   28 Text Interpretation: Sinus rhythm Borderline T wave abnormalities Confirmed by Nat Christen [phone]) on 03/05/2020 1:09:25 PM   Radiology CT Cervical Spine Wo Contrast  Result Date: 03/05/2020 CLINICAL DATA:  Neuro deficit, acute, stroke suspected. Numbness/weakness right mid  humerus distally into digits 3, 4, 5, last known normal questionable 8am. EXAM: CT HEAD WITHOUT CONTRAST CT CERVICAL SPINE WITHOUT CONTRAST TECHNIQUE: Multidetector CT imaging of the head and cervical spine was performed following the standard protocol without intravenous contrast. Multiplanar CT image reconstructions of the cervical spine were also generated. COMPARISON:  No pertinent prior studies available for comparison. FINDINGS: CT HEAD FINDINGS Brain: There is no evidence of acute intracranial hemorrhage, intracranial mass, midline shift or extra-axial fluid collection.No demarcated cortical infarction. Minimal ill-defined hypoattenuation within the cerebral white matter is nonspecific, but consistent with chronic small vessel ischemic disease. Cerebral volume is normal for age. Vascular: No hyperdense vessel. Skull: Normal. Negative for fracture or focal lesion. Sinuses/Orbits: Visualized orbits demonstrate no acute abnormality. No significant paranasal sinus disease or mastoid effusion at the imaged levels. CT CERVICAL SPINE FINDINGS Alignment: Reversal of the expected cervical lordosis. No significant spondylolisthesis. Skull base and vertebrae: The basion-dental and atlanto-dental intervals are maintained.No evidence of acute fracture to the cervical spine. Soft tissues and spinal canal: Unremarkable Disc levels: Mild multilevel disc space narrowing. There are small multilevel shallow disc bulges. No more than mild apparent spinal canal stenosis at any level. No significant bony neural foraminal narrowing within the cervical spine. Upper chest: No consolidation within the imaged lung apices. CT head results were called by telephone at the time of interpretation on 03/05/2020 at 12:22 pm to provider [facility] , who verbally acknowledged these results. IMPRESSION: CT head: 1. No evidence of acute intracranial abnormality. 2. Minimal chronic small vessel ischemic disease. CT cervical spine 1. Cervical  spondylosis as outlined with multilevel shallow disc bulges. No more than mild spinal canal stenosis. No significant bony neural foraminal narrowing. 2. No evidence of acute fracture to the cervical spine. Electronically Signed   By: Kellie Simmering DO   On: 03/05/2020 12:26   CT HEAD CODE STROKE WO CONTRAST  Result Date: 03/05/2020 CLINICAL DATA:  Neuro deficit, acute, stroke suspected. Numbness/weakness right mid humerus distally into digits 3, 4, 5, last known normal questionable 8am. EXAM: CT HEAD WITHOUT CONTRAST CT CERVICAL SPINE WITHOUT CONTRAST TECHNIQUE: Multidetector CT imaging of the head and cervical spine was performed following the standard protocol without intravenous contrast. Multiplanar CT image reconstructions of the cervical spine were also generated. COMPARISON:  No pertinent prior studies available for comparison. FINDINGS: CT HEAD FINDINGS Brain: There is no evidence of acute intracranial hemorrhage, intracranial mass, midline shift or extra-axial fluid collection.No demarcated cortical infarction. Minimal ill-defined hypoattenuation within the cerebral white matter is nonspecific, but consistent with chronic small vessel ischemic disease. Cerebral volume is normal for age. Vascular:  No hyperdense vessel. Skull: Normal. Negative for fracture or focal lesion. Sinuses/Orbits: Visualized orbits demonstrate no acute abnormality. No significant paranasal sinus disease or mastoid effusion at the imaged levels. CT CERVICAL SPINE FINDINGS Alignment: Reversal of the expected cervical lordosis. No significant spondylolisthesis. Skull base and vertebrae: The basion-dental and atlanto-dental intervals are maintained.No evidence of acute fracture to the cervical spine. Soft tissues and spinal canal: Unremarkable Disc levels: Mild multilevel disc space narrowing. There are small multilevel shallow disc bulges. No more than mild apparent spinal canal stenosis at any level. No significant bony neural  foraminal narrowing within the cervical spine. Upper chest: No consolidation within the imaged lung apices. CT head results were called by telephone at the time of interpretation on 03/05/2020 at 12:22 pm to provider [facility] , who verbally acknowledged these results. IMPRESSION: CT head: 1. No evidence of acute intracranial abnormality. 2. Minimal chronic small vessel ischemic disease. CT cervical spine 1. Cervical spondylosis as outlined with multilevel shallow disc bulges. No more than mild spinal canal stenosis. No significant bony neural foraminal narrowing. 2. No evidence of acute fracture to the cervical spine. Electronically Signed   By: Kellie Simmering DO   On: 03/05/2020 12:26    Procedures Procedures (including critical care time)  Medications Ordered in ED Medications - No data to display  ED Course  I have reviewed the triage vital signs and the nursing notes.  Pertinent labs & imaging results that were available during my care of the patient were reviewed by me and considered in my medical decision making (see chart for details).    MDM Rules/Calculators/A&P                      Code stroke initiated.  CT head shows no acute findings.  CT cervical spine reveals multilevel shallow disc bulges.  Teleneurologist uncertain of symptom complex.  Also discussed with neurologist at [facility].  Dr. Leonel Ramsay recommended a CT angio of head and neck.  Would also recommend an MRI of brain and cervical spine.  Unfortunately, no MRI available at [facility] at this time.  These findings were discussed with the patient.  We will proceed with CT angio head and neck.  1500: Discussed with Dr. Sabra Heck.   CRITICAL CARE Performed by: Nat Christen Total critical care time: 35 minutes Critical care time was exclusive of separately billable procedures and treating other patients. Critical care was necessary to treat or prevent imminent or life-threatening deterioration. Critical care was time spent  personally by me on the following activities: development of treatment plan with patient and/or surrogate as well as nursing, discussions with consultants, evaluation of patient's response to treatment, examination of patient, obtaining history from patient or surrogate, ordering and performing treatments and interventions, ordering and review of laboratory studies, ordering and review of radiographic studies, pulse oximetry and re-evaluation of patient's condition. Final Clinical Impression(s) / ED Diagnoses Final diagnoses:  Numbness and tingling of right arm    Rx / DC Orders ED Discharge Orders    None       Nat Christen, MD 03/05/20 1456    Nat Christen, MD 03/05/20 1535

## 2020-03-05 NOTE — ED Notes (Signed)
Pt back in room from CT 

## 2020-03-05 NOTE — ED Notes (Signed)
Pt in CT.

## 2020-03-06 ENCOUNTER — Observation Stay (HOSPITAL_BASED_OUTPATIENT_CLINIC_OR_DEPARTMENT_OTHER): Payer: Medicaid Other

## 2020-03-06 ENCOUNTER — Observation Stay (HOSPITAL_COMMUNITY): Payer: Medicaid Other

## 2020-03-06 DIAGNOSIS — M50222 Other cervical disc displacement at C5-C6 level: Secondary | ICD-10-CM | POA: Diagnosis not present

## 2020-03-06 DIAGNOSIS — G5620 Lesion of ulnar nerve, unspecified upper limb: Secondary | ICD-10-CM | POA: Diagnosis not present

## 2020-03-06 DIAGNOSIS — R202 Paresthesia of skin: Secondary | ICD-10-CM | POA: Diagnosis not present

## 2020-03-06 DIAGNOSIS — I6340 Cerebral infarction due to embolism of unspecified cerebral artery: Secondary | ICD-10-CM | POA: Insufficient documentation

## 2020-03-06 DIAGNOSIS — I619 Nontraumatic intracerebral hemorrhage, unspecified: Secondary | ICD-10-CM | POA: Insufficient documentation

## 2020-03-06 DIAGNOSIS — I6330 Cerebral infarction due to thrombosis of unspecified cerebral artery: Secondary | ICD-10-CM | POA: Insufficient documentation

## 2020-03-06 DIAGNOSIS — I6389 Other cerebral infarction: Secondary | ICD-10-CM

## 2020-03-06 DIAGNOSIS — I609 Nontraumatic subarachnoid hemorrhage, unspecified: Secondary | ICD-10-CM

## 2020-03-06 DIAGNOSIS — R200 Anesthesia of skin: Secondary | ICD-10-CM | POA: Diagnosis not present

## 2020-03-06 LAB — ECHOCARDIOGRAM COMPLETE
Height: 74 in
Weight: 3620.8 oz

## 2020-03-06 LAB — SARS CORONAVIRUS 2 (TAT 6-24 HRS): SARS Coronavirus 2: NEGATIVE

## 2020-03-06 LAB — HEMOGLOBIN A1C
Hgb A1c MFr Bld: 5 % (ref 4.8–5.6)
Mean Plasma Glucose: 96.8 mg/dL

## 2020-03-06 MED ORDER — TOPIRAMATE 25 MG PO TABS: 25.0000 mg | ORAL_TABLET | Freq: Every day | ORAL | Status: DC

## 2020-03-06 MED ORDER — ACETAMINOPHEN 325 MG PO TABS: 650.0000 mg | ORAL_TABLET | ORAL | 0 refills | Status: DC | PRN

## 2020-03-06 MED ORDER — TOPIRAMATE 25 MG PO TABS: 50.0000 mg | ORAL_TABLET | Freq: Two times a day (BID) | ORAL | Status: DC

## 2020-03-06 MED ORDER — GABAPENTIN 100 MG PO CAPS: 200.0000 mg | ORAL_CAPSULE | Freq: Every day | ORAL | 0 refills | Status: DC

## 2020-03-06 MED ORDER — ASPIRIN 81 MG PO CHEW: 81.0000 mg | CHEWABLE_TABLET | Freq: Every day | ORAL | 0 refills | Status: DC

## 2020-03-06 MED ORDER — TOPIRAMATE 25 MG PO TABS: 50.0000 mg | ORAL_TABLET | Freq: Every day | ORAL | Status: DC

## 2020-03-06 MED ORDER — TOPIRAMATE 25 MG PO TABS: ORAL_TABLET | ORAL | 0 refills | Status: DC

## 2020-03-06 MED ORDER — GABAPENTIN 300 MG PO CAPS: 300.0000 mg | ORAL_CAPSULE | Freq: Two times a day (BID) | ORAL | 0 refills | Status: DC

## 2020-03-06 MED ORDER — ENOXAPARIN SODIUM 40 MG/0.4ML ~~LOC~~ SOLN
40.0000 mg | SUBCUTANEOUS | Status: DC
  Filled 2020-03-06: qty 0.4

## 2020-03-06 MED ORDER — ACETAMINOPHEN 650 MG RE SUPP: 650.0000 mg | RECTAL | Status: DC | PRN

## 2020-03-06 MED ORDER — ASPIRIN 81 MG PO CHEW
81.0000 mg | CHEWABLE_TABLET | Freq: Every day | ORAL | Status: DC
  Administered 2020-03-06: 81 mg via ORAL
  Filled 2020-03-06: qty 1

## 2020-03-06 MED ORDER — ATORVASTATIN CALCIUM 40 MG PO TABS: 40.0000 mg | ORAL_TABLET | Freq: Every day | ORAL | 0 refills | Status: DC

## 2020-03-06 MED ORDER — STROKE: EARLY STAGES OF RECOVERY BOOK
Freq: Once | Status: AC
  Filled 2020-03-06 (×2): qty 1

## 2020-03-06 MED ORDER — SENNOSIDES-DOCUSATE SODIUM 8.6-50 MG PO TABS
1.0000 | ORAL_TABLET | Freq: Every evening | ORAL | Status: DC | PRN
  Filled 2020-03-06: qty 1

## 2020-03-06 MED ORDER — ACETAMINOPHEN 325 MG PO TABS
650.0000 mg | ORAL_TABLET | ORAL | Status: DC | PRN
  Administered 2020-03-06 (×2): 650 mg via ORAL
  Filled 2020-03-06: qty 2

## 2020-03-06 MED ORDER — ACETAMINOPHEN 160 MG/5ML PO SOLN: 650.0000 mg | ORAL | Status: DC | PRN

## 2020-03-06 NOTE — TOC Transition Note (Signed)
Transition of Care [facility]) - CM/SW Discharge Note   Patient Details  Name: [name] MRN: [id number] Date of Birth: Oct 26, 1966  [facility] CM/SW Contact:  Pollie Friar, RN Phone Number: 03/06/2020, 4:33 PM   Clinical Narrative:    Pt discharging home with self care. Pt without a PCP. Pt states he has a list of PCP through medicaid at home and he will arrange an appointment.  Pt has transportation home.    Final next level of care: Home/Self Care Barriers to Discharge: No Barriers Identified   Patient Goals and CMS Choice        Discharge Placement                       Discharge Plan and Services                                     Social Determinants of Health (SDOH) Interventions     Readmission Risk Interventions No flowsheet data found.

## 2020-03-06 NOTE — Progress Notes (Signed)
PT Cancellation Note  Patient Details Name: Thomas Gutierrez MRN: [id number] DOB: 07/17/1966   Cancelled Treatment:    Reason Eval/Treat Not Completed: PT screened, no needs identified, will sign off     Wyona Almas, PT, DPT Acute Rehabilitation Services Pager [phone] Office [phone]    Deno Etienne 03/06/2020, 9:26 AM

## 2020-03-06 NOTE — Evaluation (Addendum)
Occupational Therapy Evaluation and Discharge Patient Details Name: Thomas Gutierrez MRN: [id number] DOB: 1966/03/20 Today's Date: 03/06/2020    History of Present Illness Thomas Gutierrez is a 54 y.o. male with no known significant medical history.  Presented to the ED reports of sudden onset of abnormal sensation involving his right hand, especially his medial 3 fingers, moving upwards and subsequently involving his right upper extremity. MRI of head and MRI of neck both negative.   Clinical Impression   This 54 yo male admitted with above presents to acute OT at [facility] of independent (ADLs, IADLs, driving and working as a Dealer). Pt reporting still with tingling/prickly feeling of digits 3-5 on right hand (not up his arm now)--sensation and movement intact. Pt reports that about a year ago he hurt his right shoulder throwing a football and it has not been the same since (attending MD made aware through chat text). No further OT needs, we will sign off.    Follow Up Recommendations  No OT follow up    Equipment Recommendations  None recommended by OT       Precautions / Restrictions Precautions Precautions: None Restrictions Weight Bearing Restrictions: No      Mobility Bed Mobility Overal bed mobility: Independent                Transfers Overall transfer level: Independent                        ADL either performed or assessed with clinical judgement   ADL Overall ADL's : Independent                                             Vision Patient Visual Report: No change from baseline              Pertinent Vitals/Pain Pain Assessment: No/denies pain     Hand Dominance Right   Extremity/Trunk Assessment Upper Extremity Assessment Upper Extremity Assessment: RUE deficits/detail RUE Deficits / Details: Pt with limited ROM at shoudler from injury he sustained throwing a football about a year ago--"it's never been right since). Came in  with digits 3-5 numb/tingling as well as same sensation going up his forearm--now only in digits 3-5. RUE Sensation: WNL           Communication Communication Communication: No difficulties   Cognition Arousal/Alertness: Awake/alert Behavior During Therapy: WFL for tasks assessed/performed Overall Cognitive Status: Within Functional Limits for tasks assessed                                                ome Living Family/patient expects to be discharged to:: Private residence Living Arrangements: Spouse/significant other Available Help at Discharge: Family;Available PRN/intermittently                         Home Equipment: None   Additional Comments: Works as a Acupuncturist Level of Independence: Independent                 OT Problem List: Other (comment)(tingling/prickly sensation in digits 3-5)         OT Goals(Current goals can be found in  the care plan section) Acute Rehab OT Goals Patient Stated Goal: to figure out what is causing hand feelings and go home  OT Frequency:                AM-PAC OT "6 Clicks" Daily Activity     Outcome Measure Help from another person eating meals?: None Help from another person taking care of personal grooming?: None Help from another person toileting, which includes using toliet, bedpan, or urinal?: None Help from another person bathing (including washing, rinsing, drying)?: None Help from another person to put on and taking off regular upper body clothing?: None Help from another person to put on and taking off regular lower body clothing?: None 6 Click Score: 24   End of Session Equipment Utilized During Treatment: (none) Nurse Communication: (Alarm turned off pt is independent)  Activity Tolerance: Patient tolerated treatment well Patient left: in bed  OT Visit Diagnosis: Other (comment)(tingling of right hand)                Time: [id number] OT  Time Calculation (min): 11 min Charges:  OT General Charges $OT Visit: 1 Visit OT Evaluation $OT Eval Low Complexity: 1 Low  Cathy  OTR/L Acute Rehab Services Pager [phone] Office [phone]     03/06/2020, 10:24 AM

## 2020-03-06 NOTE — Progress Notes (Signed)
CODE STROKE CT TIMES  1148 Call time 1135 Beeper time [id number] Exam started 1202 Exam finished 1202 Images sent to soc 1202 Exam completed in [place] radiology called

## 2020-03-06 NOTE — Consult Note (Addendum)
Neurology Consultation  Reason for Consult: Neurologic opinion for right arm numbness Referring Physician: Swayze  CC: Right arm numbness  History is obtained from: Patient  HPI: Thomas Gutierrez is a 54 y.o. male with no past medical history.  Patient initially without any pain was seen by teleneurology for his right arm numbness, although pt denies numbness on exam and describes an odd "tingling".  Teleneurology record NIH stroke scale of 0.  But due to question of stroke patient was transferred to [facility].  Neurology was asked to see patient for possible stroke and right arm numbness upon awakening on 3/17. He specifically notes right small finger, ring finger and middle finger felt numb, but this ascended up his hand and stops in bicep area. Full ROM and strength is intact.   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  He does not believe he slept and a odd position on his right arm the night before.  Patient states that he has a long history of daily headaches.  Usually there after work and a feeling a band around his head.  Patient has never had neurological symptoms with this headache, but did describe wavy vision changes at one time. Describes no scatoma or floater.  ED course  Relevant labs include -potassium 3.4, hemoglobin A1c of 5.  Negative UDS CT head shows-no evidence of acute intracranial abnormality CTA head and neck-no large vessel occlusion, hemodynamically significant stenosis or evidence of dissection MRI brain-negative MRI brain MRI C-spine- negative for cord compression or acute findings.   LKW: 03/05/2020 at 00: 00 hours tpa given?: no, NIH stroke scale of 0 Premorbid modified Rankin scale (mRS): 0 NIH stroke score: 0  History reviewed. No pertinent past medical history per patient.   Family History  Problem Relation Age of Onset  . Cancer Mother   . Cancer Father   . Cancer Sister    Social History:   reports  that he has never smoked. His smokeless tobacco use includes snuff. He reports that he does not drink alcohol or use drugs.  Medications  Current Facility-Administered Medications:  .  acetaminophen (TYLENOL) tablet 650 mg, 650 mg, Oral, Q4H PRN, 650 mg at 03/06/20 1219 **OR** acetaminophen (TYLENOL) 160 MG/5ML solution 650 mg, 650 mg, Per Tube, Q4H PRN **OR** acetaminophen (TYLENOL) suppository 650 mg, 650 mg, Rectal, Q4H PRN, Emokpae, Ejiroghene E, MD .  aspirin chewable tablet 81 mg, 81 mg, Oral, Daily, Emokpae, Ejiroghene E, MD, 81 mg at 03/06/20 0929 .  atorvastatin (LIPITOR) tablet 40 mg, 40 mg, Oral, q1800, Emokpae, Ejiroghene E, MD, 40 mg at 03/05/20 1921 .  enoxaparin (LOVENOX) injection 40 mg, 40 mg, Subcutaneous, Q24H, Emokpae, Ejiroghene E, MD .  senna-docusate (Senokot-S) tablet 1 tablet, 1 tablet, Oral, QHS PRN, Emokpae, Ejiroghene E, MD  ROS:  Unable to obtain due to altered mental status.   General ROS: negative for - chills, fatigue, fever, night sweats, weight gain or weight loss Psychological ROS: negative for - behavioral disorder, hallucinations, memory difficulties, mood swings or suicidal ideation Ophthalmic ROS: negative for - blurry vision, double vision, eye pain or loss of vision ENT ROS: negative for - epistaxis, nasal discharge, oral lesions, sore throat, tinnitus or vertigo Allergy and Immunology ROS: negative for - hives or itchy/watery eyes Hematological and Lymphatic ROS: negative for - bleeding problems, bruising or swollen lymph nodes Endocrine ROS: negative for - galactorrhea, hair pattern changes, polydipsia/polyuria or temperature intolerance Respiratory  ROS: negative for - cough, hemoptysis, shortness of breath or wheezing Cardiovascular ROS: negative for - chest pain, dyspnea on exertion, edema or irregular heartbeat Gastrointestinal ROS: negative for - abdominal pain, diarrhea, hematemesis, nausea/vomiting or stool incontinence Genito-Urinary ROS:  negative for - dysuria, hematuria, incontinence or urinary frequency/urgency Musculoskeletal ROS: Positive for -right shoulder pain Neurological ROS: as noted in HPI Dermatological ROS: negative for rash and skin lesion changes  Exam: Current vital signs: BP 121/85 (BP Location: Left Arm)   Pulse 85   Temp (!) 97.5 F (36.4 C) (Oral)   Resp 20   Ht 6\' 2"  (1.88 m)   Wt 102.6 kg   SpO2 94%   BMI 29.06 kg/m  Vital signs in last 24 hours: Temp:  [97.5 F (36.4 C)] 97.5 F (36.4 C) (03/18 0838) Pulse Rate:  [69-89] 85 (03/18 1205) Resp:  [12-23] 20 (03/18 1205) BP: (114-132)/(77-104) 121/85 (03/18 1205) SpO2:  [94 %-99 %] 94 % (03/18 1205)   Constitutional: Appears well-developed and well-nourished.  Eyes: No scleral injection HENT: No OP obstrucion Head: Normocephalic.  Cardiovascular: Normal rate and regular rhythm.  Respiratory: Effort normal, non-labored breathing GI: Soft.  No distension. There is no tenderness.  Skin: WDI Neuro: Mental Status: Patient is awake, alert, oriented to person, place, month, year, and situation. Speech-intact naming, repeating, comprehension Patient is able to give a clear and coherent history. Cranial Nerves: II: Visual Fields are full.  III,IV, VI: EOMI without ptosis or diploplia. Pupils equal, round and reactive to light V: Facial sensation is symmetric to temperature VII: Facial movement is symmetric.  VIII: hearing is intact to voice X: Palat elevates symmetrically XI: Shoulder shrug is symmetric. XII: tongue is midline without atrophy or fasciculations.  Motor: Tone is normal. Bulk is normal. 5/5 strength was present in all four extremities.  No drift No aterixis Sensory: Sensation intact throughout.  Negative Phalen's and negative Tinel's sign Deep Tendon Reflexes: 2+ and symmetric at biceps(depressed at knees, patient states this is longstanding) Plantars: Toes are downgoing bilaterally.  Cerebellar: FNF and HKS are  intact bilaterally  Labs I have reviewed labs in epic and the results pertinent to this consultation are:  CBC    Component Value Date/Time   WBC 5.9 03/05/2020 1152   RBC 5.06 03/05/2020 1152   HGB 15.0 03/05/2020 1201   HCT 44.0 03/05/2020 1201   PLT 207 03/05/2020 1152   MCV 87.9 03/05/2020 1152   MCH 31.2 03/05/2020 1152   MCHC 35.5 03/05/2020 1152   RDW 12.3 03/05/2020 1152   LYMPHSABS 2.1 03/05/2020 1152   MONOABS 0.4 03/05/2020 1152   EOSABS 0.1 03/05/2020 1152   BASOSABS 0.0 03/05/2020 1152    CMP     Component Value Date/Time   NA 141 03/05/2020 1201   K 3.5 03/05/2020 1201   CL 103 03/05/2020 1201   CO2 27 03/05/2020 1152   GLUCOSE 103 (H) 03/05/2020 1201   BUN 13 03/05/2020 1201   CREATININE 0.90 03/05/2020 1201   CALCIUM 9.0 03/05/2020 1152   PROT 7.2 03/05/2020 1152   ALBUMIN 4.2 03/05/2020 1152   AST 22 03/05/2020 1152   ALT 28 03/05/2020 1152   ALKPHOS 79 03/05/2020 1152   BILITOT 0.7 03/05/2020 1152   GFRNONAA >60 03/05/2020 1152   GFRAA >60 03/05/2020 1152    Lipid Panel  No results found for: CHOL, TRIG, HDL, CHOLHDL, VLDL, LDLCALC, LDLDIRECT   Imaging I have reviewed the images obtained:  CT head shows-no evidence of  acute intracranial abnormality CTA head and neck-no large vessel occlusion, hemodynamically significant stenosis or evidence of dissection MRI brain-negative MRI brain  Etta Quill PA-C [facility] [phone]  M-F  (9:00 am- 5:00 PM)  03/06/2020, 1:37 PM   I have seen the patient and reviewed the above note. On my exam, he really indicates an ulner distribution for the tingling that he has been having. There is no weakness of interossei, or other   Assessment:  54 year old male with no past medical history with 24+ hours of right arm tingling.  He describes an ulner distribution, and I suspect he may have a mild entrapment. Given the lack of objective motor or sensory findings, would recommend outpatient  follow up for nerve conduction studies.   Impression: - Ulner neuropathy   Recommendations: 1) Given the fact that his CTA head and neck, MRI of the brain and C-spine all showed no etiology would recommend patient be seen as an outpatient for an EMG nerve conduction study in 2 to 4 weeks if symptoms do not resolve 2) gabapentin 300mg  qhs x 3 days, then 300mg  TID.   Roland Rack, MD [facility] [phone]  If 7pm- 7am, please page neurology on call as listed in [place].

## 2020-03-09 NOTE — Discharge Summary (Signed)
Physician Discharge Summary  [name] [id number] DOB: 1966-02-03 DOA: 03/05/2020  PCP: Patient, No Pcp Per  Admit date: 03/05/2020 Discharge date: 03/09/2020  Recommendations for Outpatient Follow-up:  1. Discharge to home 2. Follow up with PCP in 7-10 days. 3. Follow up with neurology as directed.  Follow-up Information    Thomas Beam, MD Follow up in 1 month(s).   Specialty: Neurology Why: as needed for migrane tx Contact information: [address] [place] [address] [phone]          Discharge Diagnoses: Principal diagnosis is #1 1.  Right upper extremity numbness 2. Ulnar neuropathy  Discharge Condition: Fair  Disposition: Home  Diet recommendation: Regular  Filed Weights   03/05/20 1146  Weight: 102.6 kg    History of present illness: Thomas Gutierrez is a 54 y.o. male with no known significant medical history.  Presented to the ED reports of sudden onset of abnormal sensation involving his right hand, especially his medial 3 fingers, moving upwards and subsequently involving his right upper extremity.  He describes the sensation as an electric shock, tingling, but he still able to to feel touch.  Denies any weakness of his extremities.  No facial asymmetry, no slurred speech. He uses smokeless tobacco snuff, never smoked cigarettes.  Occasionally uses marijuana. He denies history of neck pain.  He is not on any medications.  Numbness of his right upper extremity is persistent.  Reports occasional vision changes-blurriness, intermittent over the past 2 weeks, lasting about 3 seconds.  ED Course: Stable vitals.  EKG shows sinus rhythm.  Head CT, CTA head and neck negative for acute abnormality, no large vessel occlusion, no hemodynamically significant stenosis or dissection.  Cervical spine CT also unremarkable.  Telemetry neurology consulted recommended stroke work-up.  [facility] not available at [name], EDP talked to [name], on-call for  neurology at [facility], okay with transfer to complete stroke work-up.  Hospital Course:  The patient was admitted to at telemetry bed. He had a CTA head and neck, CT head, negative CT spine, negative MRI. The patient was evaluated by neurology. They have diagnosed the patient with an ulnar neuropathy. He will be discharged to home with neurontin and follow up with neurology as outpatient.  Today's assessment: S: The patient is resting comfortably. No new complaints. O: Vitals:  Vitals:   03/06/20 1205 03/06/20 1628  BP: 121/85 122/89  Pulse: 85 83  Resp: 20 20  Temp:  98.3 F (36.8 C)  SpO2: 94% 95%    Exam:  Constitutional:  . The patient is awake, alert, and oriented x 3. No acute distress. Respiratory:  . No increased work of breathing. . No wheezes, rales, or rhonchi . No tactile fremitus Cardiovascular:  . Regular rate and rhythm . No murmurs, ectopy, or gallups. . No lateral PMI. No thrills. Abdomen:  . Abdomen is soft, non-tender, non-distended . No hernias, masses, or organomegaly . Normoactive bowel sounds.  Musculoskeletal:  . No cyanosis, clubbing, or edema Skin:  . No rashes, lesions, ulcers . palpation of skin: no induration or nodules Neurologic:  . CN 2-12 intact . Sensation all 4 extremities intact Psychiatric:  . Mental status o Mood, affect appropriate o Orientation to person, place, time  . judgment and insight appear intact   Discharge Instructions  Discharge Instructions    Activity as tolerated - No restrictions   Complete by: As directed    Call MD for:  persistant nausea and  vomiting   Complete by: As directed    Call MD for:  severe uncontrolled pain   Complete by: As directed    Diet - low sodium heart healthy   Complete by: As directed    Discharge instructions   Complete by: As directed    Discharge to home Follow up with PCP in 7-10 days. Follow up with neurology as directed.   Increase activity slowly   Complete by: As  directed      Allergies as of 03/06/2020      Reactions   Haldol [haloperidol Lactate] Other (See Comments)   REACTION: Muscle tightness (tension) all over body for 6 hours.       Medication List    STOP taking these medications   ADVIL LIQUI-GELS MINIS PO   BC HEADACHE POWDER PO     TAKE these medications   acetaminophen 325 MG tablet Commonly known as: TYLENOL Take 2 tablets (650 mg total) by mouth every 4 (four) hours as needed for mild pain (or temp > 37.5 C (99.5 F)).   aspirin 81 MG chewable tablet Chew 1 tablet (81 mg total) by mouth daily.   atorvastatin 40 MG tablet Commonly known as: LIPITOR Take 1 tablet (40 mg total) by mouth daily at 6 PM.   gabapentin 100 MG capsule Commonly known as: Neurontin Take 2 capsules (200 mg total) by mouth at bedtime for 10 days.   gabapentin 300 MG capsule Commonly known as: Neurontin Take 1 capsule (300 mg total) by mouth 2 (two) times daily. Start taking on: March 16, 2020   topiramate 25 MG tablet Commonly known as: TOPAMAX (1) 25 mg tablet x 7 days to start on 03/06/2020, then (2) 25 mg tablets at bedtime x 7 days to start on 03/13/2020, then (10 25 mg tablet each morning and (2) 25 mg tablet at bedtime x 7 days to start on 03/20/2020, then continue as (2) 25 mg tabs twice daily ongoing.      Allergies  Allergen Reactions  . Haldol [Haloperidol Lactate] Other (See Comments)    REACTION: Muscle tightness (tension) all over body for 6 hours.     The results of significant diagnostics from this hospitalization (including imaging, microbiology, ancillary and laboratory) are listed below for reference.    Significant Diagnostic Studies: CT Angio Head W or Wo Contrast  Result Date: 03/05/2020 CLINICAL DATA:  Right arm numbness, code stroke follow-up EXAM: CT ANGIOGRAPHY HEAD AND NECK TECHNIQUE: Multidetector CT imaging of the head and neck was performed using the standard protocol during bolus administration of intravenous  contrast. Multiplanar CT image reconstructions and MIPs were obtained to evaluate the vascular anatomy. Carotid stenosis measurements (when applicable) are obtained utilizing NASCET criteria, using the distal internal carotid diameter as the denominator. CONTRAST:  95mL OMNIPAQUE IOHEXOL 350 MG/ML SOLN COMPARISON:  None. FINDINGS: CT HEAD FINDINGS Brain: There is no acute intracranial hemorrhage, mass effect, or edema. There is no new loss of gray-white differentiation to suggest evolving recent infarction. Ventricles and sulci are normal in size and configuration. Vascular: No new finding. Skull: Unremarkable. Sinuses: No new finding. Orbits: No new finding. Review of the MIP images confirms the above findings CTA NECK FINDINGS Aortic arch: Great vessel origins are patent. Right carotid system: Patent. No measurable stenosis or evidence of dissection. Left carotid system: Patent. No measurable stenosis or evidence of dissection. Vertebral arteries: Patent. Left vertebral artery is slightly dominant. No measurable stenosis or evidence of dissection. Skeleton: No significant abnormality.  Other neck: No mass or adenopathy. Upper chest: No apical lung mass Review of the MIP images confirms the above findings CTA HEAD FINDINGS Anterior circulation: Intracranial internal carotid arteries are patent. Anterior and middle cerebral arteries are patent. Posterior circulation: Intracranial vertebral arteries, basilar artery, and posterior cerebral arteries are patent. Venous sinuses: As permitted by contrast timing, patent. Review of the MIP images confirms the above findings IMPRESSION: No acute intracranial hemorrhage or new loss of gray-white differentiation to suggest evolving infarction. No large vessel occlusion, hemodynamically significant stenosis, or evidence of dissection. Electronically Signed   By: Macy Mis M.D.   On: 03/05/2020 16:56   CT Angio Neck W and/or Wo Contrast  Result Date: 03/05/2020 CLINICAL  DATA:  Right arm numbness, code stroke follow-up EXAM: CT ANGIOGRAPHY HEAD AND NECK TECHNIQUE: Multidetector CT imaging of the head and neck was performed using the standard protocol during bolus administration of intravenous contrast. Multiplanar CT image reconstructions and MIPs were obtained to evaluate the vascular anatomy. Carotid stenosis measurements (when applicable) are obtained utilizing NASCET criteria, using the distal internal carotid diameter as the denominator. CONTRAST:  54mL OMNIPAQUE IOHEXOL 350 MG/ML SOLN COMPARISON:  None. FINDINGS: CT HEAD FINDINGS Brain: There is no acute intracranial hemorrhage, mass effect, or edema. There is no new loss of gray-white differentiation to suggest evolving recent infarction. Ventricles and sulci are normal in size and configuration. Vascular: No new finding. Skull: Unremarkable. Sinuses: No new finding. Orbits: No new finding. Review of the MIP images confirms the above findings CTA NECK FINDINGS Aortic arch: Great vessel origins are patent. Right carotid system: Patent. No measurable stenosis or evidence of dissection. Left carotid system: Patent. No measurable stenosis or evidence of dissection. Vertebral arteries: Patent. Left vertebral artery is slightly dominant. No measurable stenosis or evidence of dissection. Skeleton: No significant abnormality. Other neck: No mass or adenopathy. Upper chest: No apical lung mass Review of the MIP images confirms the above findings CTA HEAD FINDINGS Anterior circulation: Intracranial internal carotid arteries are patent. Anterior and middle cerebral arteries are patent. Posterior circulation: Intracranial vertebral arteries, basilar artery, and posterior cerebral arteries are patent. Venous sinuses: As permitted by contrast timing, patent. Review of the MIP images confirms the above findings IMPRESSION: No acute intracranial hemorrhage or new loss of gray-white differentiation to suggest evolving infarction. No large  vessel occlusion, hemodynamically significant stenosis, or evidence of dissection. Electronically Signed   By: Macy Mis M.D.   On: 03/05/2020 16:56   CT Cervical Spine Wo Contrast  Result Date: 03/05/2020 CLINICAL DATA:  Neuro deficit, acute, stroke suspected. Numbness/weakness right mid humerus distally into digits 3, 4, 5, last known normal questionable 8am. EXAM: CT HEAD WITHOUT CONTRAST CT CERVICAL SPINE WITHOUT CONTRAST TECHNIQUE: Multidetector CT imaging of the head and cervical spine was performed following the standard protocol without intravenous contrast. Multiplanar CT image reconstructions of the cervical spine were also generated. COMPARISON:  No pertinent prior studies available for comparison. FINDINGS: CT HEAD FINDINGS Brain: There is no evidence of acute intracranial hemorrhage, intracranial mass, midline shift or extra-axial fluid collection.No demarcated cortical infarction. Minimal ill-defined hypoattenuation within the cerebral white matter is nonspecific, but consistent with chronic small vessel ischemic disease. Cerebral volume is normal for age. Vascular: No hyperdense vessel. Skull: Normal. Negative for fracture or focal lesion. Sinuses/Orbits: Visualized orbits demonstrate no acute abnormality. No significant paranasal sinus disease or mastoid effusion at the imaged levels. CT CERVICAL SPINE FINDINGS Alignment: Reversal of the expected cervical lordosis. No significant spondylolisthesis.  Skull base and vertebrae: The basion-dental and atlanto-dental intervals are maintained.No evidence of acute fracture to the cervical spine. Soft tissues and spinal canal: Unremarkable Disc levels: Mild multilevel disc space narrowing. There are small multilevel shallow disc bulges. No more than mild apparent spinal canal stenosis at any level. No significant bony neural foraminal narrowing within the cervical spine. Upper chest: No consolidation within the imaged lung apices. CT head results were  called by telephone at the time of interpretation on 03/05/2020 at 12:22 pm to provider [facility] , who verbally acknowledged these results. IMPRESSION: CT head: 1. No evidence of acute intracranial abnormality. 2. Minimal chronic small vessel ischemic disease. CT cervical spine 1. Cervical spondylosis as outlined with multilevel shallow disc bulges. No more than mild spinal canal stenosis. No significant bony neural foraminal narrowing. 2. No evidence of acute fracture to the cervical spine. Electronically Signed   By: Kellie Simmering DO   On: 03/05/2020 12:26   MR BRAIN WO CONTRAST  Result Date: 03/06/2020 CLINICAL DATA:  Ascending paresthesia in the right arm. EXAM: MRI HEAD WITHOUT CONTRAST TECHNIQUE: Multiplanar, multiecho pulse sequences of the brain and surrounding structures were obtained without intravenous contrast. COMPARISON:  CT and CTA from earlier today FINDINGS: Brain: No infarction, hemorrhage, hydrocephalus, extra-axial collection or mass lesion. No generalized white matter disease or atrophy Vascular: Normal flow voids Skull and upper cervical spine: Normal marrow signal Sinuses/Orbits: Negative IMPRESSION: Negative brain MRI.  No explanation for symptoms. Electronically Signed   By: Monte Fantasia M.D.   On: 03/06/2020 05:13   MR Cervical Spine Wo Contrast  Result Date: 03/06/2020 CLINICAL DATA:  Ascending paralysis of right arm EXAM: MRI CERVICAL SPINE WITHOUT CONTRAST TECHNIQUE: Multiplanar, multisequence MR imaging of the cervical spine was performed. No intravenous contrast was administered. COMPARISON:  None. FINDINGS: Alignment: Upper cervical reversal of lordosis. Vertebrae: No fracture, evidence of discitis, or aggressive bone lesion. Incidental hemangiomas noted at T2. Cord: Normal signal and morphology. Posterior Fossa, vertebral arteries, paraspinal tissues: Negative. No evidence of inflammation or mass. Disc levels: C2-3: Unremarkable. C3-4: Minor bulging or central protrusion.  No facet spurring or impingement C4-5: Unremarkable. C5-6: Mild disc narrowing and ridging. Mild disc bulging. No impingement C6-7: Unremarkable. C7-T1:Unremarkable. IMPRESSION: 1. No impingement or other explanation for right arm symptoms. 2. 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 Electronically Signed   By: Monte Fantasia M.D.   On: 03/06/2020 05:16   ECHOCARDIOGRAM COMPLETE  Result Date: 03/06/2020    ECHOCARDIOGRAM REPORT   Patient Name:   JOSKAR MCCUTCHEON Date of Exam: 03/06/2020 Medical Rec #:  [id number]     Height:       74.0 in Accession #:    [id number]    Weight:       226.3 lb Date of Birth:  1966/12/07     BSA:          2.291 m Patient Age:    10 years      BP:           116/79 mmHg Patient Gender: M             HR:           68 bpm. Exam Location:  Inpatient Procedure: 2D Echo Indications:    Stroke I163.9  History:        Patient has no prior history of Echocardiogram examinations.  Sonographer:    Mikki Santee RDCS (AE) Referring Phys: [place]  1.  Left ventricular ejection fraction, by estimation, is 55 to 60%. The left ventricle has normal function. The left ventricle has no regional wall motion abnormalities. Left ventricular diastolic parameters were normal.  2. Right ventricular systolic function is normal. The right ventricular size is normal. There is normal pulmonary artery systolic pressure. The estimated right ventricular systolic pressure is [id number] mmHg.  3. The mitral valve is grossly normal. Trivial mitral valve regurgitation. No evidence of mitral stenosis.  4. The aortic valve is tricuspid. Aortic valve regurgitation is not visualized. No aortic stenosis is present.  5. The inferior vena cava is normal in size with greater than 50% respiratory variability, suggesting right atrial pressure of 3 mmHg. Conclusion(s)/Recommendation(s): No intracardiac source of embolism detected on this transthoracic study. A transesophageal echocardiogram is recommended  to exclude cardiac source of embolism if clinically indicated. FINDINGS  Left Ventricle: Left ventricular ejection fraction, by estimation, is 55 to 60%. The left ventricle has normal function. The left ventricle has no regional wall motion abnormalities. The left ventricular internal cavity size was normal in size. There is  borderline asymmetric left ventricular hypertrophy of the basal-septal segment. Left ventricular diastolic parameters were normal. Normal left ventricular filling pressure. Right Ventricle: The right ventricular size is normal. No increase in right ventricular wall thickness. Right ventricular systolic function is normal. There is normal pulmonary artery systolic pressure. The tricuspid regurgitant velocity is 1.64 m/s, and  with an assumed right atrial pressure of 3 mmHg, the estimated right ventricular systolic pressure is [id number] mmHg. Left Atrium: Left atrial size was normal in size. Right Atrium: Right atrial size was normal in size. Pericardium: Trivial pericardial effusion is present. Presence of pericardial fat pad. Mitral Valve: The mitral valve is grossly normal. Trivial mitral valve regurgitation. No evidence of mitral valve stenosis. Tricuspid Valve: The tricuspid valve is grossly normal. Tricuspid valve regurgitation is trivial. No evidence of tricuspid stenosis. Aortic Valve: The aortic valve is tricuspid. Aortic valve regurgitation is not visualized. No aortic stenosis is present. Pulmonic Valve: The pulmonic valve was grossly normal. Pulmonic valve regurgitation is not visualized. No evidence of pulmonic stenosis. Aorta: The aortic root is normal in size and structure. Venous: The inferior vena cava is normal in size with greater than 50% respiratory variability, suggesting right atrial pressure of 3 mmHg. IAS/Shunts: No atrial level shunt detected by color flow Doppler.  LEFT VENTRICLE PLAX 2D LVIDd:         4.60 cm  Diastology LVIDs:         3.40 cm  LV e' lateral:   7.62 cm/s LV  PW:         1.10 cm  LV E/e' lateral: 7.9 LV IVS:        1.20 cm  LV e' medial:    6.74 cm/s LVOT diam:     2.50 cm  LV E/e' medial:  8.9 LV SV:         108 LV SV Index:   47 LVOT Area:     4.91 cm  RIGHT VENTRICLE RV S prime:     11.50 cm/s TAPSE (M-mode): 1.1 cm LEFT ATRIUM             Index       RIGHT ATRIUM           Index LA diam:        2.70 cm 1.18 cm/m  RA Area:     11.70 cm LA Vol (A2C):   61.3 ml 26.76  ml/m RA Volume:   21.10 ml  9.21 ml/m LA Vol (A4C):   38.9 ml 16.98 ml/m LA Biplane Vol: 50.9 ml 22.22 ml/m  AORTIC VALVE LVOT Vmax:   76.90 cm/s LVOT Vmean:  46.900 cm/s LVOT VTI:    0.221 m  AORTA Ao Root diam: 3.80 cm MITRAL VALVE               TRICUSPID VALVE MV Area (PHT): 2.66 cm    TR Peak grad:   10.8 mmHg MV Decel Time: 285 msec    TR Vmax:        164.00 cm/s MV E velocity: 60.10 cm/s MV A velocity: 51.90 cm/s  SHUNTS MV E/A ratio:  1.16        Systemic VTI:  0.22 m                            Systemic Diam: 2.50 cm Eleonore Chiquito MD Electronically signed by Eleonore Chiquito MD Signature Date/Time: 03/06/2020/10:29:38 AM    Final    CT HEAD CODE STROKE WO CONTRAST  Result Date: 03/05/2020 CLINICAL DATA:  Neuro deficit, acute, stroke suspected. Numbness/weakness right mid humerus distally into digits 3, 4, 5, last known normal questionable 8am. EXAM: CT HEAD WITHOUT CONTRAST CT CERVICAL SPINE WITHOUT CONTRAST TECHNIQUE: Multidetector CT imaging of the head and cervical spine was performed following the standard protocol without intravenous contrast. Multiplanar CT image reconstructions of the cervical spine were also generated. COMPARISON:  No pertinent prior studies available for comparison. FINDINGS: CT HEAD FINDINGS Brain: There is no evidence of acute intracranial hemorrhage, intracranial mass, midline shift or extra-axial fluid collection.No demarcated cortical infarction. Minimal ill-defined hypoattenuation within the cerebral white matter is nonspecific, but consistent with chronic  small vessel ischemic disease. Cerebral volume is normal for age. Vascular: No hyperdense vessel. Skull: Normal. Negative for fracture or focal lesion. Sinuses/Orbits: Visualized orbits demonstrate no acute abnormality. No significant paranasal sinus disease or mastoid effusion at the imaged levels. CT CERVICAL SPINE FINDINGS Alignment: Reversal of the expected cervical lordosis. No significant spondylolisthesis. Skull base and vertebrae: The basion-dental and atlanto-dental intervals are maintained.No evidence of acute fracture to the cervical spine. Soft tissues and spinal canal: Unremarkable Disc levels: Mild multilevel disc space narrowing. There are small multilevel shallow disc bulges. No more than mild apparent spinal canal stenosis at any level. No significant bony neural foraminal narrowing within the cervical spine. Upper chest: No consolidation within the imaged lung apices. CT head results were called by telephone at the time of interpretation on 03/05/2020 at 12:22 pm to provider [facility] , who verbally acknowledged these results. IMPRESSION: CT head: 1. No evidence of acute intracranial abnormality. 2. Minimal chronic small vessel ischemic disease. CT cervical spine 1. Cervical spondylosis as outlined with multilevel shallow disc bulges. No more than mild spinal canal stenosis. No significant bony neural foraminal narrowing. 2. No evidence of acute fracture to the cervical spine. Electronically Signed   By: Kellie Simmering DO   On: 03/05/2020 12:26    Microbiology: Recent Results (from the past 240 hour(s))  SARS CORONAVIRUS 2 (TAT 6-24 HRS) Nasopharyngeal Nasopharyngeal Swab     Status: None   Collection Time: 03/05/20  6:35 PM   Specimen: Nasopharyngeal Swab  Result Value Ref Range Status   SARS Coronavirus 2 NEGATIVE NEGATIVE Final    Comment: (NOTE) SARS-CoV-2 target nucleic acids are NOT DETECTED. The SARS-CoV-2 RNA is generally detectable in upper and  lower respiratory specimens during  the acute phase of infection. Negative results do not preclude SARS-CoV-2 infection, do not rule out co-infections with other pathogens, and should not be used as the sole basis for treatment or other patient management decisions. Negative results must be combined with clinical observations, patient history, and epidemiological information. The expected result is Negative. Fact Sheet for Patients: SugarRoll.be Fact Sheet for Healthcare Providers: https://www.woods-mathews.com/ This test is not yet approved or cleared by the [place] FDA and  has been authorized for detection and/or diagnosis of SARS-CoV-2 by FDA under an Emergency Use Authorization (EUA). This EUA will remain  in effect (meaning this test can be used) for the duration of the COVID-19 declaration under Section 56 4(b)(1) of the Act, 21 U.S.C. section 360bbb-3(b)(1), unless the authorization is terminated or revoked sooner. Performed at [facility], [place] [address] [place], [place] [address]      Labs: Basic Metabolic Panel: Recent Labs  Lab 03/05/20 1152 03/05/20 1201  NA 139 141  K 3.4* 3.5  CL 104 103  CO2 27  --   GLUCOSE 103* 103*  BUN 14 13  CREATININE 0.94 0.90  CALCIUM 9.0  --    Liver Function Tests: Recent Labs  Lab 03/05/20 1152  AST 22  ALT 28  ALKPHOS 79  BILITOT 0.7  PROT 7.2  ALBUMIN 4.2   No results for input(s): LIPASE, AMYLASE in the last 168 hours. No results for input(s): AMMONIA in the last 168 hours. CBC: Recent Labs  Lab 03/05/20 1152 03/05/20 1201  WBC 5.9  --   NEUTROABS 3.4  --   HGB 15.8 15.0  HCT 44.5 44.0  MCV 87.9  --   PLT 207  --    Cardiac Enzymes: No results for input(s): CKTOTAL, CKMB, CKMBINDEX, TROPONINI in the last 168 hours. BNP: BNP (last 3 results) No results for input(s): BNP in the last 8760 hours.  ProBNP (last 3 results) No results for input(s): PROBNP in the last 8760 hours.  CBG: No  results for input(s): GLUCAP in the last 168 hours.  Active Problems:   Right upper extremity numbness   Cerebral thrombosis with cerebral infarction   Cerebral embolism with cerebral infarction   Subarachnoid hemorrhage   Intracerebral hemorrhage   Time coordinating discharge: 38 minutes  Signed:        Maxten Shuler, DO [facility]  03/09/2020, 5:00 PM

## 2020-04-11 ENCOUNTER — Other Ambulatory Visit: Payer: Self-pay

## 2020-04-11 ENCOUNTER — Emergency Department (HOSPITAL_COMMUNITY)
Admission: EM | Admit: 2020-04-11 | Discharge: 2020-04-11 | Disposition: A | Discharge status: Home / Self Care | Payer: Medicaid Other | Attending: Emergency Medicine | Admitting: Emergency Medicine

## 2020-04-11 ENCOUNTER — Encounter (HOSPITAL_COMMUNITY): Payer: Self-pay | Admitting: *Deleted

## 2020-04-11 ENCOUNTER — Emergency Department (HOSPITAL_COMMUNITY): Payer: Medicaid Other

## 2020-04-11 DIAGNOSIS — R110 Nausea: Secondary | ICD-10-CM | POA: Diagnosis not present

## 2020-04-11 DIAGNOSIS — R42 Dizziness and giddiness: Secondary | ICD-10-CM

## 2020-04-11 DIAGNOSIS — Z7982 Long term (current) use of aspirin: Secondary | ICD-10-CM | POA: Diagnosis not present

## 2020-04-11 DIAGNOSIS — R55 Syncope and collapse: Secondary | ICD-10-CM | POA: Diagnosis not present

## 2020-04-11 DIAGNOSIS — R0902 Hypoxemia: Secondary | ICD-10-CM | POA: Diagnosis not present

## 2020-04-11 DIAGNOSIS — Z79899 Other long term (current) drug therapy: Secondary | ICD-10-CM | POA: Insufficient documentation

## 2020-04-11 LAB — CBC WITH DIFFERENTIAL/PLATELET
Abs Immature Granulocytes: 0.02 10*3/uL (ref 0.00–0.07)
Basophils Absolute: 0 10*3/uL (ref 0.0–0.1)
Basophils Relative: 0 %
Eosinophils Absolute: 0 10*3/uL (ref 0.0–0.5)
Eosinophils Relative: 0 %
HCT: 45.5 % (ref 39.0–52.0)
Hemoglobin: 16.1 g/dL (ref 13.0–17.0)
Immature Granulocytes: 0 %
Lymphocytes Relative: 11 %
Lymphs Abs: 1.1 10*3/uL (ref 0.7–4.0)
MCH: 31.2 pg (ref 26.0–34.0)
MCHC: 35.4 g/dL (ref 30.0–36.0)
MCV: 88.2 fL (ref 80.0–100.0)
Monocytes Absolute: 0.4 10*3/uL (ref 0.1–1.0)
Monocytes Relative: 4 %
Neutro Abs: 8.4 10*3/uL — ABNORMAL HIGH (ref 1.7–7.7)
Neutrophils Relative %: 85 %
Platelets: 191 10*3/uL (ref 150–400)
RBC: 5.16 MIL/uL (ref 4.22–5.81)
RDW: 12.2 % (ref 11.5–15.5)
WBC: 9.9 10*3/uL (ref 4.0–10.5)
nRBC: 0 % (ref 0.0–0.2)

## 2020-04-11 LAB — BASIC METABOLIC PANEL
Anion gap: 8 (ref 5–15)
BUN: 14 mg/dL (ref 6–20)
CO2: 26 mmol/L (ref 22–32)
Calcium: 9 mg/dL (ref 8.9–10.3)
Chloride: 102 mmol/L (ref 98–111)
Creatinine, Ser: 0.9 mg/dL (ref 0.61–1.24)
GFR calc Af Amer: >60 mL/min (ref >60)
GFR calc non Af Amer: >60 mL/min (ref >60)
Glucose, Bld: 111 mg/dL — ABNORMAL HIGH (ref 70–99)
Potassium: 3.7 mmol/L (ref 3.5–5.1)
Sodium: 136 mmol/L (ref 135–145)

## 2020-04-11 MED ORDER — MECLIZINE HCL 12.5 MG PO TABS
25.0000 mg | ORAL_TABLET | Freq: Once | ORAL | Status: AC
  Administered 2020-04-11: 25 mg via ORAL
  Filled 2020-04-11: qty 2

## 2020-04-11 MED ORDER — PREDNISONE 50 MG PO TABS
60.0000 mg | ORAL_TABLET | Freq: Once | ORAL | Status: AC
  Administered 2020-04-11: 60 mg via ORAL
  Filled 2020-04-11: qty 1

## 2020-04-11 MED ORDER — HYDROCODONE-ACETAMINOPHEN 5-325 MG PO TABS
1.0000 | ORAL_TABLET | Freq: Once | ORAL | Status: AC
  Administered 2020-04-11: 17:00:00 1 via ORAL
  Filled 2020-04-11: qty 1

## 2020-04-11 MED ORDER — PREDNISONE 50 MG PO TABS
50.0000 mg | ORAL_TABLET | Freq: Every day | ORAL | 0 refills | Status: DC
Start: 2020-04-11 — End: 2020-04-14

## 2020-04-11 MED ORDER — MECLIZINE HCL 25 MG PO TABS
25.0000 mg | ORAL_TABLET | Freq: Three times a day (TID) | ORAL | 0 refills | Status: DC | PRN
Start: 2020-04-11 — End: 2020-07-08

## 2020-04-11 NOTE — Discharge Instructions (Addendum)
Please YouTube "Epley Maneuver" and see if that helps with your movement-induced dizziness symptoms.  Please take the meclizine and prednisone, as prescribed.  Please follow-up with the neurologist for ongoing evaluation and management of your headache disorder as well as your new onset dizziness symptoms.  It is also vitally important that you get established with a primary care provider for ongoing evaluation management of your health wellbeing.  Please return to the ED or seek immediate medical attention should you experience any new or worsening symptoms.
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
## 2020-04-11 NOTE — ED Triage Notes (Signed)
Patient called EMS due to nearly passing out while working in a garage.  Patient states he felt dizzy and felt like he would pass out.  Per EMS, patient cbg 93, and has vomited multiple times.

## 2020-04-14 ENCOUNTER — Encounter: Payer: Self-pay | Admitting: Nurse Practitioner

## 2020-04-14 ENCOUNTER — Other Ambulatory Visit: Payer: Self-pay

## 2020-04-14 ENCOUNTER — Ambulatory Visit (INDEPENDENT_AMBULATORY_CARE_PROVIDER_SITE_OTHER): Payer: Medicaid Other | Admitting: Nurse Practitioner

## 2020-04-14 VITALS — BP 130/78 | HR 81 | Temp 98.0°F | Resp 18 | Ht 74.0 in | Wt 216.8 lb

## 2020-04-14 DIAGNOSIS — Z13220 Encounter for screening for lipoid disorders: Secondary | ICD-10-CM

## 2020-04-14 DIAGNOSIS — E785 Hyperlipidemia, unspecified: Secondary | ICD-10-CM

## 2020-04-14 DIAGNOSIS — Z125 Encounter for screening for malignant neoplasm of prostate: Secondary | ICD-10-CM | POA: Diagnosis not present

## 2020-04-14 DIAGNOSIS — J342 Deviated nasal septum: Secondary | ICD-10-CM | POA: Diagnosis not present

## 2020-04-14 DIAGNOSIS — Z9189 Other specified personal risk factors, not elsewhere classified: Secondary | ICD-10-CM | POA: Diagnosis not present

## 2020-04-14 DIAGNOSIS — R7309 Other abnormal glucose: Secondary | ICD-10-CM | POA: Diagnosis not present

## 2020-04-14 DIAGNOSIS — R42 Dizziness and giddiness: Secondary | ICD-10-CM

## 2020-04-14 DIAGNOSIS — Z13228 Encounter for screening for other metabolic disorders: Secondary | ICD-10-CM | POA: Diagnosis not present

## 2020-04-14 NOTE — Progress Notes (Signed)
New Patient Office Visit  Subjective:  Patient ID: GIOVANNY SCARCELLA, male    DOB: 04/27/1966  Age: 54 y.o. MRN: [id number]  CC:  Chief Complaint  Patient presents with  . Establish Care    NP    HPI KEYANTE KHOURY is a 54 year old male presenting to establish care. No cp/ct, gu/gi sxs, pain, sob, edema, or recent falls.  He does report recent ER visit for dizziness and right third through fifth finger numbness. Reviewed record and workup, pt needs referrals for insurance purposes to neurology.  Discussed his nasal septal deviation and desire for ENT.    Not interested in disease prevention at this time until sxs resolved.  Tdap and Cscope discussed and considering.   History reviewed. No pertinent past medical history.  History reviewed. No pertinent surgical history.  Family History  Problem Relation Age of Onset  . Cancer Mother   . Cancer Father   . Cancer Sister     Social History   Socioeconomic History  . Marital status: Single    Spouse name: Not on file  . Number of children: Not on file  . Years of education: Not on file  . Highest education level: Not on file  Occupational History  . Not on file  Tobacco Use  . Smoking status: Never Smoker  . Smokeless tobacco: Current User    Types: Snuff  Substance and Sexual Activity  . Alcohol use: No  . Drug use: No  . Sexual activity: Yes  Other Topics Concern  . Not on file  Social History Narrative  . Not on file   Social Determinants of Health   Financial Resource Strain:   . Difficulty of Paying Living Expenses:   Food Insecurity:   . Worried About Charity fundraiser in the Last Year:   . Arboriculturist in the Last Year:   Transportation Needs:   . Film/video editor (Medical):   Marland Kitchen Lack of Transportation (Non-Medical):   Physical Activity:   . Days of Exercise per Week:   . Minutes of Exercise per Session:   Stress:   . Feeling of Stress :   Social Connections:   . Frequency of  Communication with Friends and Family:   . Frequency of Social Gatherings with Friends and Family:   . Attends Religious Services:   . Active Member of Clubs or Organizations:   . Attends Archivist Meetings:   Marland Kitchen Marital Status:   Intimate Partner Violence:   . Fear of Current or Ex-Partner:   . Emotionally Abused:   Marland Kitchen Physically Abused:   . Sexually Abused:     ROS Review of Systems  All other systems reviewed and are negative.   Objective:   Today's Vitals: BP 130/78 (BP Location: Left Arm, Patient Position: Sitting, Cuff Size: Large)   Pulse 81   Temp 98 F (36.7 C) (Temporal)   Resp 18   Ht 6\' 2"  (1.88 m)   Wt 216 lb 12.8 oz (98.3 kg)   SpO2 98%   BMI 27.84 kg/m   Physical Exam Vitals and nursing note reviewed.  Constitutional:      Appearance: Normal appearance. He is well-developed and well-groomed. He is not ill-appearing.  HENT:     Head: Normocephalic and atraumatic.     Right Ear: External ear normal.     Left Ear: External ear normal.     Nose: Septal deviation present. No congestion or  rhinorrhea.     Mouth/Throat:     Lips: Pink.     Mouth: Mucous membranes are dry.     Pharynx: Oropharynx is clear.  Eyes:     General: Lids are normal. Lids are everted, no foreign bodies appreciated.     Extraocular Movements: Extraocular movements intact.     Conjunctiva/sclera: Conjunctivae normal.     Pupils: Pupils are equal, round, and reactive to light.  Neck:     Thyroid: No thyromegaly or thyroid tenderness.     Vascular: No carotid bruit or JVD.  Cardiovascular:     Rate and Rhythm: Normal rate and regular rhythm.     Pulses: Normal pulses.     Heart sounds: Normal heart sounds, S1 normal and S2 normal.  Pulmonary:     Effort: Pulmonary effort is normal.     Breath sounds: Normal breath sounds.  Chest:     Chest wall: No swelling or tenderness.  Abdominal:     General: Abdomen is flat. Bowel sounds are normal. There is no abdominal bruit.       Palpations: Abdomen is soft. There is no hepatomegaly or splenomegaly.  Musculoskeletal:        General: Normal range of motion.     Cervical back: Full passive range of motion without pain, normal range of motion and neck supple.     Right lower leg: No edema.     Left lower leg: No edema.  Lymphadenopathy:     Cervical: No cervical adenopathy.  Skin:    General: Skin is warm and dry.     Capillary Refill: Capillary refill takes less than 2 seconds.  Neurological:     General: No focal deficit present.     Mental Status: He is alert and oriented to person, place, and time.  Psychiatric:        Attention and Perception: Attention normal.        Mood and Affect: Mood normal.        Speech: Speech normal.        Behavior: Behavior normal. Behavior is cooperative.        Thought Content: Thought content normal.        Cognition and Memory: Cognition normal.        Judgment: Judgment normal.    Keep a log of blood pressure readings at home and supply to nurse in one week. BP goals <140/90.   Neurology referral r/t recent ER visit where they referred however insurance needing PCP referral: vertigo and right digit 3-5 tingling.  ENT referral r/t nasal stuffiness and waking in the night cant breath with h/o nasal septum deviation.   Keep taking your Neurontin, Topamax, and Antivert for your sxs as prescribed by the ER.   Get plenty of rest, Drink plenty of water.  Complete labs today will call with result and directions.   Assessment & Plan:   Problem List Items Addressed This Visit    None    Visit Diagnoses    Hyperlipidemia, unspecified hyperlipidemia type    -  Primary   Elevated glucose       Relevant Orders   Hemoglobin A1c   At risk for impaired function of liver       Prostate cancer screening       Relevant Orders   PSA   Lipid screening       Relevant Orders   Lipid panel   Screening for metabolic disorder  Relevant Orders   COMPLETE METABOLIC PANEL  WITH GFR   Deviated septum       Relevant Orders   Ambulatory referral to ENT   Vertigo       Relevant Orders   Ambulatory referral to Neurology      Outpatient Encounter Medications as of 04/14/2020  Medication Sig  . meclizine (ANTIVERT) 25 MG tablet Take 1 tablet (25 mg total) by mouth 3 (three) times daily as needed for dizziness.  . [DISCONTINUED] acetaminophen (TYLENOL) 325 MG tablet Take 2 tablets (650 mg total) by mouth every 4 (four) hours as needed for mild pain (or temp > 37.5 C (99.5 F)).  . [DISCONTINUED] topiramate (TOPAMAX) 25 MG tablet (1) 25 mg tablet x 7 days to start on 03/06/2020, then (2) 25 mg tablets at bedtime x 7 days to start on 03/13/2020, then (10 25 mg tablet each morning and (2) 25 mg tablet at bedtime x 7 days to start on 03/20/2020, then continue as (2) 25 mg tabs twice daily ongoing.  Marland Kitchen atorvastatin (LIPITOR) 40 MG tablet Take 1 tablet (40 mg total) by mouth daily at 6 PM. (Patient not taking: Reported on 04/14/2020)  . [DISCONTINUED] aspirin 81 MG chewable tablet Chew 1 tablet (81 mg total) by mouth daily.  . [DISCONTINUED] gabapentin (NEURONTIN) 100 MG capsule Take 2 capsules (200 mg total) by mouth at bedtime for 10 days.  . [DISCONTINUED] gabapentin (NEURONTIN) 300 MG capsule Take 1 capsule (300 mg total) by mouth 2 (two) times daily.  . [DISCONTINUED] predniSONE (DELTASONE) 50 MG tablet Take 1 tablet (50 mg total) by mouth daily with breakfast for 4 days.   No facility-administered encounter medications on file as of 04/14/2020.    Follow-up: Return in about 6 months (around 10/14/2020).   Annie Main, FNP

## 2020-04-14 NOTE — Patient Instructions (Addendum)
Keep a log of blood pressure readings at home and supply to nurse in one week. BP goals <140/90.   Neurology referral r/t recent ER visit where they referred however insurance needing PCP referral: vertigo and right digit 3-5 tingling.  ENT referral r/t nasal stuffiness and waking in the night cant breath with h/o nasal septum deviation.   Keep taking your Neurontin, Topamax, and Antivert for your sxs as prescribed by the ER.   Get plenty of rest, Drink plenty of water.

## 2020-04-15 ENCOUNTER — Telehealth: Payer: Self-pay | Admitting: Nurse Practitioner

## 2020-04-15 LAB — COMPLETE METABOLIC PANEL WITH GFR
AG Ratio: 1.7 (calc) (ref 1.0–2.5)
ALT: 25 U/L (ref 9–46)
AST: 14 U/L (ref 10–35)
Albumin: 4.5 g/dL (ref 3.6–5.1)
Alkaline phosphatase (APISO): 87 U/L (ref 35–144)
BUN: 19 mg/dL (ref 7–25)
CO2: 31 mmol/L (ref 20–32)
Calcium: 10.1 mg/dL (ref 8.6–10.3)
Chloride: 102 mmol/L (ref 98–110)
Creat: 0.99 mg/dL (ref 0.70–1.33)
GFR, Est African American: 100 mL/min/{1.73_m2} (ref >=60)
GFR, Est Non African American: 87 mL/min/{1.73_m2} (ref >=60)
Globulin: 2.7 g/dL (calc) (ref 1.9–3.7)
Glucose, Bld: 107 mg/dL — ABNORMAL HIGH (ref 65–99)
Potassium: 3.9 mmol/L (ref 3.5–5.3)
Sodium: 142 mmol/L (ref 135–146)
Total Bilirubin: 0.6 mg/dL (ref 0.2–1.2)
Total Protein: 7.2 g/dL (ref 6.1–8.1)

## 2020-04-15 LAB — HEMOGLOBIN A1C
Hgb A1c MFr Bld: 5 % of total Hgb (ref <5.7)
Mean Plasma Glucose: 97 (calc)
eAG (mmol/L): 5.4 (calc)

## 2020-04-15 LAB — LIPID PANEL
Cholesterol: 193 mg/dL (ref <200)
HDL: 38 mg/dL — ABNORMAL LOW (ref >=40)
LDL Cholesterol (Calc): 124 mg/dL (calc) — ABNORMAL HIGH
Non-HDL Cholesterol (Calc): 155 mg/dL (calc) — ABNORMAL HIGH (ref <130)
Total CHOL/HDL Ratio: 5.1 (calc) — ABNORMAL HIGH (ref <5.0)
Triglycerides: 184 mg/dL — ABNORMAL HIGH (ref <150)

## 2020-04-15 LAB — PSA: PSA: 0.3 ng/mL (ref <=4.0)

## 2020-04-15 NOTE — Telephone Encounter (Signed)
The pts sxs are worsening. Instructions are to go to the ER. Yes I can order a MRI but it would need to be scheduled and weeks before it could be scheduled and with a pt who is having increasing sxs of vertigo that could represent a life threatening condition emergency care and testing is indicated. I realize that he was recently seen in the ER but his sxs are worsening, this is another ER visit and I would not recommend waiting.   Larene Beach:  As far as the ENT does not take Medicaid I will ask Larene Beach or referral liaison to refer to another facility that hopefully will take his Medicaid.

## 2020-04-15 NOTE — Progress Notes (Signed)
Let pt know results: He is not a Diabetic His prostate seems to be in good health based on PSA His triglycerides are slightly elevated and I recommend lifestyle first such as increasing exercise and lower fats and cholesterol in diet. F/U labs in 6 months.

## 2020-04-15 NOTE — Telephone Encounter (Signed)
Pt notified. I stated to pt your instructions and that we put in another referral that excepts his insurance. Pt verbalized understanding.

## 2020-04-15 NOTE — Telephone Encounter (Signed)
Wife called back very agitated because he has already been to the ED. Wife would like an MRI ordered and the ENT referral does not accept Medicaid. Wife stated that pt is also agitated and does not want to come back to a Dr. Please advise.

## 2020-04-15 NOTE — Telephone Encounter (Signed)
Patient is calling to say that the meclizine that was prescribed for him is making him very dizzy almost to the point of calling 911 would like to discuss   [phone]

## 2020-04-15 NOTE — Telephone Encounter (Signed)
Advice the pt that the meclizine is prescribed to help his vertigo and if it is worse his directions where to go to the ER. Please proceed to the ER.

## 2020-04-15 NOTE — Telephone Encounter (Signed)
Patient's referral sent to Dr. Deeann Saint office

## 2020-04-15 NOTE — Telephone Encounter (Signed)
See note

## 2020-04-15 NOTE — Telephone Encounter (Signed)
Pt notified. Verbalizes understanding. I advised pt if he is unable to drive to call [phone] to transport him to ED.

## 2020-05-05 ENCOUNTER — Ambulatory Visit: Payer: Medicaid Other | Admitting: Diagnostic Neuroimaging

## 2020-05-05 ENCOUNTER — Encounter: Payer: Self-pay | Admitting: Diagnostic Neuroimaging

## 2020-05-05 ENCOUNTER — Other Ambulatory Visit: Payer: Self-pay

## 2020-05-05 VITALS — BP 143/88 | HR 86 | Temp 97.4°F | Ht 74.0 in | Wt 217.8 lb

## 2020-05-05 DIAGNOSIS — R42 Dizziness and giddiness: Secondary | ICD-10-CM

## 2020-05-05 DIAGNOSIS — G43809 Other migraine, not intractable, without status migrainosus: Secondary | ICD-10-CM

## 2020-05-05 MED ORDER — AMITRIPTYLINE HCL 25 MG PO TABS: 25.0000 mg | ORAL_TABLET | Freq: Every day | ORAL | 6 refills | Status: DC

## 2020-05-05 MED ORDER — RIZATRIPTAN BENZOATE 10 MG PO TBDP
10.0000 mg | ORAL_TABLET | ORAL | 11 refills | Status: DC | PRN
Start: 2020-05-05 — End: 2021-01-07

## 2020-05-05 NOTE — Progress Notes (Signed)
[facility]  PATIENT: Thomas Gutierrez DOB: 06/13/1966  REFERRING CLINICIAN: Annie Main, FNP HISTORY FROM: patient  REASON FOR VISIT: new consult    HISTORICAL  CHIEF COMPLAINT:  Chief Complaint  Patient presents with  . 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  04/11/2020 patient presented to emergency room for dizziness and near syncope.  He was working underneath a car when he stood up and subsequently got dizzy and fell down.  No loss of consciousness.  Since that time anytime he moves his head he feels spinning sensation and nausea.  He is having occipital headaches.  Has some tenderness that is longstanding.  Sometimes he sees flashing lights, sensitive to light and lightheadedness.  He was prescribed meclizine without relief.  Patient also has been to the hospital from 03/05/2020 until 03/09/2020 for evaluation of right hand numbness and palpitations.  He was admitted for stroke work-up.  MRI of the brain and cervical spine, CTA of the head and neck, CT of the head and cervical spine all were unremarkable.  He was diagnosed with probable peripheral neuropathy and recommended to have outpatient follow-up.  Patient also has chronic insomnia, sleeps 4 to 5 hours at night.  Of note he does tend to sleep better when he is camping outside sleeping in a hammock.  He has history of deviated septum which interrupts his breathing and sleeping at night.  No prior history of migraines.  No family history of migraine.  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  He typically used a respirator but did not always have full body and eye  protection.    REVIEW OF SYSTEMS: Full 14 system review of systems performed and negative with exception of: As per HPI.  ALLERGIES: Allergies  Allergen Reactions  . Haldol [Haloperidol Lactate] Other (See Comments)    REACTION: Muscle tightness (tension) all over body for 6 hours.     HOME MEDICATIONS: Outpatient Medications Prior to Visit  Medication Sig Dispense Refill  . Acetaminophen (TYLENOL PO) Take by mouth. OTC for headaches as needed    . Aspirin-Caffeine 845-65 MG PACK Take by mouth as needed. For headaches    . ibuprofen (ADVIL) 200 MG tablet Take 200 mg by mouth every 6 (six) hours as needed for headache.    . Multiple Vitamins-Minerals (MULTIVITAMIN ADULT) CHEW Chew by mouth.    Marland Kitchen atorvastatin (LIPITOR) 40 MG tablet Take 1 tablet (40 mg total) by mouth daily at 6 PM. (Patient not taking: Reported on 04/14/2020) 30 tablet 0  . meclizine (ANTIVERT) 25 MG tablet Take 1 tablet (25 mg total) by mouth 3 (three) times daily as needed for dizziness. (Patient not taking: Reported on 05/05/2020) 30 tablet 0   No facility-administered medications prior to visit.    PAST MEDICAL HISTORY: Past Medical History:  Diagnosis Date  . Vertigo     PAST SURGICAL HISTORY: No past surgical history on file.  FAMILY HISTORY: Family History  Problem Relation Age of Onset  . Cancer Mother        breast  . Cancer Father 59  lung  . Cancer Sister        breast    SOCIAL HISTORY: Social History   Socioeconomic History  . Marital status: Single    Spouse name: Not on file  . Number of children: 5  . Years of education: Not on file  . Highest education level: High school graduate  Occupational History    Comment: NA  Tobacco Use  . Smoking status: Never Smoker  . Smokeless tobacco: Current User    Types: Snuff  Substance and Sexual Activity  . Alcohol use: No  . Drug use: No  . Sexual activity: Yes  Other Topics Concern  . Not on file  Social History Narrative    Lives with sig other x 27 years   Caffeine- sweet tea all day   I hike the [employer], play basketball   Social Determinants of Health   Financial Resource Strain:   . Difficulty of Paying Living Expenses:   Food Insecurity:   . Worried About Charity fundraiser in the Last Year:   . Arboriculturist in the Last Year:   Transportation Needs:   . Film/video editor (Medical):   Marland Kitchen Lack of Transportation (Non-Medical):   Physical Activity:   . Days of Exercise per Week:   . Minutes of Exercise per Session:   Stress:   . Feeling of Stress :   Social Connections:   . Frequency of Communication with Friends and Family:   . Frequency of Social Gatherings with Friends and Family:   . Attends Religious Services:   . Active Member of Clubs or Organizations:   . Attends Archivist Meetings:   Marland Kitchen Marital Status:   Intimate Partner Violence:   . Fear of Current or Ex-Partner:   . Emotionally Abused:   Marland Kitchen Physically Abused:   . Sexually Abused:      PHYSICAL EXAM  GENERAL EXAM/CONSTITUTIONAL: Vitals:  Vitals:   05/05/20 1119  BP: (!) 143/88  Pulse: 86  Temp: (!) 97.4 F (36.3 C)  Weight: 217 lb 12.8 oz (98.8 kg)  Height: 6\' 2"  (1.88 m)     Body mass index is 27.96 kg/m. Wt Readings from Last 3 Encounters:  05/05/20 217 lb 12.8 oz (98.8 kg)  04/14/20 216 lb 12.8 oz (98.3 kg)  04/11/20 225 lb (102.1 kg)     Patient is in no distress; well developed, nourished and groomed; neck is supple  CARDIOVASCULAR:  Examination of carotid arteries is normal; no carotid bruits  Regular rate and rhythm, no murmurs  Examination of peripheral vascular system by observation and palpation is normal  EYES:  Ophthalmoscopic exam of optic discs and posterior segments is normal; no papilledema or hemorrhages  No exam data present  MUSCULOSKELETAL:  Gait, strength, tone, movements noted in Neurologic exam below  NEUROLOGIC: MENTAL STATUS:  No flowsheet data  found.  awake, alert, oriented to person, place and time  recent and remote memory intact  normal attention and concentration  language fluent, comprehension intact, naming intact  fund of knowledge appropriate  CRANIAL NERVE:   2nd - no papilledema on fundoscopic exam  2nd, 3rd, 4th, 6th - pupils equal and reactive to light, visual fields full to confrontation, extraocular muscles intact, no nystagmus  5th - facial sensation symmetric  7th - facial strength symmetric  8th - hearing intact  9th - palate elevates symmetrically, uvula midline  11th - shoulder shrug symmetric  12th - tongue protrusion midline  MOTOR:   normal bulk and tone, full strength in the BUE, BLE  SENSORY:   normal and symmetric to light touch, temperature, vibration  COORDINATION:   finger-nose-finger, fine finger movements normal  REFLEXES:   deep tendon reflexes present and symmetric  GAIT/STATION:   narrow based gait     DIAGNOSTIC DATA (LABS, IMAGING, TESTING) - I reviewed patient records, labs, notes, testing and imaging myself where available.  Lab Results  Component Value Date   WBC 9.9 04/11/2020   HGB 16.1 04/11/2020   HCT 45.5 04/11/2020   MCV 88.2 04/11/2020   PLT 191 04/11/2020      Component Value Date/Time   NA 142 04/14/2020 1106   K 3.9 04/14/2020 1106   CL 102 04/14/2020 1106   CO2 31 04/14/2020 1106   GLUCOSE 107 (H) 04/14/2020 1106   BUN 19 04/14/2020 1106   CREATININE 0.99 04/14/2020 1106   CALCIUM 10.1 04/14/2020 1106   PROT 7.2 04/14/2020 1106   ALBUMIN 4.2 03/05/2020 1152   AST 14 04/14/2020 1106   ALT 25 04/14/2020 1106   ALKPHOS 79 03/05/2020 1152   BILITOT 0.6 04/14/2020 1106   GFRNONAA 87 04/14/2020 1106   GFRAA 100 04/14/2020 1106   Lab Results  Component Value Date   CHOL 193 04/14/2020   HDL 38 (L) 04/14/2020   LDLCALC 124 (H) 04/14/2020   TRIG 184 (H) 04/14/2020   CHOLHDL 5.1 (H) 04/14/2020   Lab Results  Component Value  Date   HGBA1C 5.0 04/14/2020   No results found for: VITAMINB12 No results found for: TSH  03/05/20 CTA head / neck No acute intracranial hemorrhage or new loss of gray-white differentiation to suggest evolving infarction. No large vessel occlusion, hemodynamically significant stenosis, or evidence of dissection.  03/06/20 MRI brain [I reviewed images myself and agree with interpretation. -[facility]]  Negative brain MRI.  No explanation for symptoms.  03/06/20 MRI cervical spine 1. No impingement or other explanation for right arm symptoms. 2. 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   ASSESSMENT AND PLAN  54 y.o. year old male here with:   Dx:  1. Dizziness   2. Vestibular migraine     PLAN:  positional vertigo (April 2021) - vestibular migraine vs BPPV  MIGRAINE PREVENTION  -Stop or avoid smoking -Decrease or avoid caffeine / alcohol -Eat and sleep on a regular schedule -Exercise several times per week -start amitriptyline 25mg  at bedtime  MIGRAINE RESCUE  - ibuprofen, tylenol as needed - rizatriptan (Maxalt) 10mg  as needed for breakthrough headache; may repeat x 1 after 2 hours; max 2 tabs per day or 8 per month   Orders Placed This Encounter  Procedures  . PT vestibular rehab    Meds ordered this encounter  Medications  . rizatriptan (MAXALT-MLT) 10 MG disintegrating tablet    Sig: Take 1 tablet (10 mg total) by mouth as needed for migraine. May repeat in 2 hours if needed    Dispense:  9 tablet    Refill:  11  . amitriptyline (ELAVIL) 25 MG tablet    Sig: Take 1 tablet (25 mg total) by mouth at bedtime.    Dispense:  30 tablet    Refill:  6   Return in about 6 months (around 11/05/2020) for with NP (Amy Lomax).    Penni Bombard, MD [id number], [id number] PM Certified in Neurology, Neurophysiology and Neuroimaging  [facility] [address], [place], [place] [address] [phone]

## 2020-05-05 NOTE — Patient Instructions (Signed)
positional vertigo (April 2021) - vestibular migraine vs BPPV  MIGRAINE PREVENTION  -Stop or avoid smoking -Decrease or avoid caffeine / alcohol -Eat and sleep on a regular schedule -Exercise several times per week -start amitriptyline 25mg  at bedtime  MIGRAINE RESCUE  - ibuprofen, tylenol as needed - rizatriptan (Maxalt) 10mg  as needed for breakthrough headache; may repeat x 1 after 2 hours; max 2 tabs per day or 8 per month

## 2020-05-14 DIAGNOSIS — J343 Hypertrophy of nasal turbinates: Secondary | ICD-10-CM | POA: Diagnosis not present

## 2020-05-14 DIAGNOSIS — J310 Chronic rhinitis: Secondary | ICD-10-CM | POA: Diagnosis not present

## 2020-05-14 DIAGNOSIS — J342 Deviated nasal septum: Secondary | ICD-10-CM | POA: Diagnosis not present

## 2020-06-11 DIAGNOSIS — J310 Chronic rhinitis: Secondary | ICD-10-CM | POA: Diagnosis not present

## 2020-06-11 DIAGNOSIS — J342 Deviated nasal septum: Secondary | ICD-10-CM | POA: Diagnosis not present

## 2020-06-11 DIAGNOSIS — J343 Hypertrophy of nasal turbinates: Secondary | ICD-10-CM | POA: Diagnosis not present

## 2020-06-12 ENCOUNTER — Other Ambulatory Visit: Payer: Self-pay | Admitting: Otolaryngology

## 2020-07-08 ENCOUNTER — Ambulatory Visit (INDEPENDENT_AMBULATORY_CARE_PROVIDER_SITE_OTHER): Payer: Medicaid Other | Admitting: Nurse Practitioner

## 2020-07-08 ENCOUNTER — Other Ambulatory Visit: Payer: Self-pay

## 2020-07-08 VITALS — BP 128/80 | HR 75 | Temp 97.6°F | Resp 18 | Wt 213.0 lb

## 2020-07-08 DIAGNOSIS — L600 Ingrowing nail: Secondary | ICD-10-CM

## 2020-07-08 MED ORDER — MUPIROCIN 2 % EX OINT: 1.0000 "application " | TOPICAL_OINTMENT | Freq: Two times a day (BID) | CUTANEOUS | 0 refills | Status: DC

## 2020-07-08 NOTE — Progress Notes (Signed)
Established Patient Office Visit  Subjective:  Patient ID: Thomas Gutierrez, male    DOB: [birth year]  Age: 54 y.o. MRN: [id number]  CC:  Chief Complaint  Patient presents with  . Ingrown Toenail    R big toe, infected    HPI Thomas Gutierrez is a 54 year old male presenting ingrown toenail to left great toe. The pt reports that he cut his toenail at a curve as he always does without ever having an ingrown toenail. He went hiking this weekend and his toe began to hurt. He noticed that he had an ingrown toenail so he tried to remove it himself at home. He did remove partial. He denied fever, chills. He report minimal clear discharge without foul odor. He has not tried any other treatments.   Removal of the ingrown toenail treatment discussed with pt today and he refused. He would rather other option discussed of soaking with warm salt water twice daily, cleaning with antibacterial soap and applying antibiotic ointment as directed. Seeking medical attention for worsening or non resolving sxs.   Past Medical History:  Diagnosis Date  . Vertigo     No past surgical history on file.  Family History  Problem Relation Age of Onset  . Cancer Mother        breast  . Cancer Father 45       lung  . Cancer Sister        breast    Social History   Socioeconomic History  . Marital status: Single    Spouse name: Not on file  . Number of children: 5  . Years of education: Not on file  . Highest education level: High school graduate  Occupational History    Comment: NA  Tobacco Use  . Smoking status: Never Smoker  . Smokeless tobacco: Current User    Types: Snuff  Vaping Use  . Vaping Use: Never used  Substance and Sexual Activity  . Alcohol use: No  . Drug use: No  . Sexual activity: Yes  Other Topics Concern  . Not on file  Social History Narrative   Lives with sig other x 27 years   Caffeine- sweet tea all day   I hike the [employer], play basketball   Social  Determinants of Health   Financial Resource Strain:   . Difficulty of Paying Living Expenses:   Food Insecurity:   . Worried About Charity fundraiser in the Last Year:   . Arboriculturist in the Last Year:   Transportation Needs:   . Film/video editor (Medical):   Marland Kitchen Lack of Transportation (Non-Medical):   Physical Activity:   . Days of Exercise per Week:   . Minutes of Exercise per Session:   Stress:   . Feeling of Stress :   Social Connections:   . Frequency of Communication with Friends and Family:   . Frequency of Social Gatherings with Friends and Family:   . Attends Religious Services:   . Active Member of Clubs or Organizations:   . Attends Archivist Meetings:   Marland Kitchen Marital Status:   Intimate Partner Violence:   . Fear of Current or Ex-Partner:   . Emotionally Abused:   Marland Kitchen Physically Abused:   . Sexually Abused:     Outpatient Medications Prior to Visit  Medication Sig Dispense Refill  . Acetaminophen (TYLENOL PO) Take by mouth. OTC for headaches as needed    . Aspirin-Caffeine 845-65  MG PACK Take by mouth as needed. For headaches    . ibuprofen (ADVIL) 200 MG tablet Take 200 mg by mouth every 6 (six) hours as needed for headache.    . Multiple Vitamins-Minerals (MULTIVITAMIN ADULT) CHEW Chew by mouth.    . rizatriptan (MAXALT-MLT) 10 MG disintegrating tablet Take 1 tablet (10 mg total) by mouth as needed for migraine. May repeat in 2 hours if needed 9 tablet 11  . amitriptyline (ELAVIL) 25 MG tablet Take 1 tablet (25 mg total) by mouth at bedtime. 30 tablet 6  . atorvastatin (LIPITOR) 40 MG tablet Take 1 tablet (40 mg total) by mouth daily at 6 PM. (Patient not taking: Reported on 04/14/2020) 30 tablet 0  . meclizine (ANTIVERT) 25 MG tablet Take 1 tablet (25 mg total) by mouth 3 (three) times daily as needed for dizziness. (Patient not taking: Reported on 05/05/2020) 30 tablet 0   No facility-administered medications prior to visit.    Allergies    Allergen Reactions  . Haldol [Haloperidol Lactate] Other (See Comments)    REACTION: Muscle tightness (tension) all over body for 6 hours.     ROS Review of Systems  All other systems reviewed and are negative.     Objective:    Physical Exam Vitals and nursing note reviewed.  HENT:     Head: Normocephalic.  Eyes:     Extraocular Movements: Extraocular movements intact.     Conjunctiva/sclera: Conjunctivae normal.     Pupils: Pupils are equal, round, and reactive to light.  Cardiovascular:     Rate and Rhythm: Normal rate.  Pulmonary:     Effort: Pulmonary effort is normal.  Musculoskeletal:     Right lower leg: No edema.     Left lower leg: No edema.       Feet:  Feet:     Right foot:     Toenail Condition: Right toenails are ingrown.     Comments: Minimal small ingrown toenail no foul odor, scant amount of clear drainage. Tender to touch. Skin:    General: Skin is warm and dry.     Coloration: Skin is not jaundiced.  Neurological:     General: No focal deficit present.     Mental Status: He is alert and oriented to person, place, and time.  Psychiatric:        Mood and Affect: Mood normal.        Behavior: Behavior normal.        Thought Content: Thought content normal.        Judgment: Judgment normal.     BP 128/80 (BP Location: Left Arm, Patient Position: Sitting, Cuff Size: Normal)   Pulse 75   Temp 97.6 F (36.4 C) (Temporal)   Resp 18   Wt 213 lb (96.6 kg)   SpO2 98%   BMI 27.35 kg/m  Wt Readings from Last 3 Encounters:  07/08/20 213 lb (96.6 kg)  05/05/20 217 lb 12.8 oz (98.8 kg)  04/14/20 216 lb 12.8 oz (98.3 kg)     Health Maintenance Due  Topic Date Due  . Hepatitis C Screening  Never done  . HIV Screening  Never done  . TETANUS/TDAP  Never done  . COLONOSCOPY  Never done    There are no preventive care reminders to display for this patient.  No results found for: TSH Lab Results  Component Value Date   WBC 9.9 04/11/2020    HGB 16.1 04/11/2020   HCT 45.5 04/11/2020  MCV 88.2 04/11/2020   PLT 191 04/11/2020   Lab Results  Component Value Date   NA 142 04/14/2020   K 3.9 04/14/2020   CO2 31 04/14/2020   GLUCOSE 107 (H) 04/14/2020   BUN 19 04/14/2020   CREATININE 0.99 04/14/2020   BILITOT 0.6 04/14/2020   ALKPHOS 79 03/05/2020   AST 14 04/14/2020   ALT 25 04/14/2020   PROT 7.2 04/14/2020   ALBUMIN 4.2 03/05/2020   CALCIUM 10.1 04/14/2020   ANIONGAP 8 04/11/2020   Lab Results  Component Value Date   CHOL 193 04/14/2020   Lab Results  Component Value Date   HDL 38 (L) 04/14/2020   Lab Results  Component Value Date   LDLCALC 124 (H) 04/14/2020   Lab Results  Component Value Date   TRIG 184 (H) 04/14/2020   Lab Results  Component Value Date   CHOLHDL 5.1 (H) 04/14/2020   Lab Results  Component Value Date   HGBA1C 5.0 04/14/2020      Assessment & Plan:   Problem List Items Addressed This Visit    None    Visit Diagnoses    Ingrown toenail of right foot with infection    -  Primary   Relevant Medications   mupirocin ointment (BACTROBAN) 2 %    Removal of the ingrown toenail treatment discussed with pt today and he refused. He would rather other option discussed of soaking with warm salt water twice daily, cleaning with antibacterial soap and applying antibiotic ointment as directed. Seeking medical attention for worsening or non resolving sxs.  Meds ordered this encounter  Medications  . mupirocin ointment (BACTROBAN) 2 %    Sig: Apply 1 application topically 2 (two) times daily.    Dispense:  22 g    Refill:  0    Follow-up: Return if symptoms worsen or fail to improve.    Annie Main, FNP

## 2020-07-08 NOTE — Patient Instructions (Signed)
soaking with warm salt water twice daily, cleaning with antibacterial soap and applying antibiotic ointment as directed. Seeking medical attention for worsening or non resolving sxs.

## 2020-07-14 ENCOUNTER — Other Ambulatory Visit: Payer: Self-pay

## 2020-07-14 ENCOUNTER — Encounter (HOSPITAL_BASED_OUTPATIENT_CLINIC_OR_DEPARTMENT_OTHER): Payer: Self-pay | Admitting: Otolaryngology

## 2020-07-17 ENCOUNTER — Other Ambulatory Visit (HOSPITAL_COMMUNITY)
Admission: RE | Admit: 2020-07-17 | Discharge: 2020-07-17 | Disposition: A | Discharge status: Home / Self Care | Payer: Medicaid Other | Source: Ambulatory Visit | Attending: Otolaryngology | Admitting: Otolaryngology

## 2020-07-17 DIAGNOSIS — Z20822 Contact with and (suspected) exposure to covid-19: Secondary | ICD-10-CM | POA: Diagnosis not present

## 2020-07-17 DIAGNOSIS — Z01812 Encounter for preprocedural laboratory examination: Secondary | ICD-10-CM | POA: Insufficient documentation

## 2020-07-17 LAB — SARS CORONAVIRUS 2 (TAT 6-24 HRS): SARS Coronavirus 2: NEGATIVE

## 2020-07-21 ENCOUNTER — Encounter (HOSPITAL_BASED_OUTPATIENT_CLINIC_OR_DEPARTMENT_OTHER)
Admission: RE | Disposition: A | Discharge status: Home / Self Care | Payer: Self-pay | Source: Ambulatory Visit | Attending: Otolaryngology

## 2020-07-21 ENCOUNTER — Ambulatory Visit (HOSPITAL_BASED_OUTPATIENT_CLINIC_OR_DEPARTMENT_OTHER): Payer: Medicaid Other | Admitting: Anesthesiology

## 2020-07-21 ENCOUNTER — Ambulatory Visit (HOSPITAL_BASED_OUTPATIENT_CLINIC_OR_DEPARTMENT_OTHER)
Admission: RE | Admit: 2020-07-21 | Discharge: 2020-07-21 | Disposition: A | Discharge status: Home / Self Care | Payer: Medicaid Other | Source: Ambulatory Visit | Attending: Otolaryngology | Admitting: Otolaryngology

## 2020-07-21 ENCOUNTER — Encounter (HOSPITAL_BASED_OUTPATIENT_CLINIC_OR_DEPARTMENT_OTHER): Payer: Self-pay | Admitting: Otolaryngology

## 2020-07-21 DIAGNOSIS — I6340 Cerebral infarction due to embolism of unspecified cerebral artery: Secondary | ICD-10-CM | POA: Diagnosis not present

## 2020-07-21 DIAGNOSIS — J3489 Other specified disorders of nose and nasal sinuses: Secondary | ICD-10-CM | POA: Diagnosis not present

## 2020-07-21 DIAGNOSIS — J342 Deviated nasal septum: Secondary | ICD-10-CM | POA: Insufficient documentation

## 2020-07-21 DIAGNOSIS — J343 Hypertrophy of nasal turbinates: Secondary | ICD-10-CM | POA: Diagnosis not present

## 2020-07-21 HISTORY — PX: NASAL SEPTOPLASTY W/ TURBINOPLASTY: SHX2070

## 2020-07-21 SURGERY — SEPTOPLASTY, NOSE, WITH NASAL TURBINATE REDUCTION
Anesthesia: General | Site: Nose | Laterality: Bilateral

## 2020-07-21 MED ORDER — SUCCINYLCHOLINE CHLORIDE 20 MG/ML IJ SOLN
INTRAMUSCULAR | Status: DC | PRN
  Administered 2020-07-21: 140 mg via INTRAVENOUS

## 2020-07-21 MED ORDER — AMOXICILLIN 875 MG PO TABS: 875.0000 mg | ORAL_TABLET | Freq: Two times a day (BID) | ORAL | 0 refills | Status: AC

## 2020-07-21 MED ORDER — FENTANYL CITRATE (PF) 100 MCG/2ML IJ SOLN
INTRAMUSCULAR | Status: DC | PRN
  Administered 2020-07-21 (×2): 100 ug via INTRAVENOUS

## 2020-07-21 MED ORDER — FENTANYL CITRATE (PF) 100 MCG/2ML IJ SOLN
INTRAMUSCULAR | Status: AC
  Filled 2020-07-21: qty 2

## 2020-07-21 MED ORDER — ONDANSETRON HCL 4 MG/2ML IJ SOLN
INTRAMUSCULAR | Status: DC | PRN
  Administered 2020-07-21: 4 mg via INTRAVENOUS

## 2020-07-21 MED ORDER — LIDOCAINE-EPINEPHRINE 1 %-1:100000 IJ SOLN
INTRAMUSCULAR | Status: DC | PRN
  Administered 2020-07-21: 3.5 mL

## 2020-07-21 MED ORDER — PROMETHAZINE HCL 25 MG/ML IJ SOLN
6.2500 mg | INTRAMUSCULAR | Status: DC | PRN
  Administered 2020-07-21: 6.25 mg via INTRAVENOUS

## 2020-07-21 MED ORDER — ROCURONIUM BROMIDE 10 MG/ML (PF) SYRINGE
PREFILLED_SYRINGE | INTRAVENOUS | Status: AC
  Filled 2020-07-21: qty 10

## 2020-07-21 MED ORDER — MIDAZOLAM HCL 2 MG/2ML IJ SOLN
INTRAMUSCULAR | Status: AC
  Filled 2020-07-21: qty 2

## 2020-07-21 MED ORDER — PROPOFOL 10 MG/ML IV BOLUS
INTRAVENOUS | Status: DC | PRN
  Administered 2020-07-21: 180 mg via INTRAVENOUS

## 2020-07-21 MED ORDER — ONDANSETRON HCL 4 MG/2ML IJ SOLN
4.0000 mg | Freq: Once | INTRAMUSCULAR | Status: AC
  Administered 2020-07-21: 4 mg via INTRAVENOUS

## 2020-07-21 MED ORDER — CEFAZOLIN SODIUM 1 G IJ SOLR
INTRAMUSCULAR | Status: AC
  Filled 2020-07-21: qty 20

## 2020-07-21 MED ORDER — HYDROMORPHONE HCL 1 MG/ML IJ SOLN
INTRAMUSCULAR | Status: AC
  Filled 2020-07-21: qty 0.5

## 2020-07-21 MED ORDER — LIDOCAINE HCL (CARDIAC) PF 100 MG/5ML IV SOSY
PREFILLED_SYRINGE | INTRAVENOUS | Status: DC | PRN
  Administered 2020-07-21: 100 mg via INTRAVENOUS

## 2020-07-21 MED ORDER — ONDANSETRON HCL 4 MG/2ML IJ SOLN
INTRAMUSCULAR | Status: AC
  Filled 2020-07-21: qty 2

## 2020-07-21 MED ORDER — OXYCODONE-ACETAMINOPHEN 5-325 MG PO TABS: 1.0000 | ORAL_TABLET | ORAL | 0 refills | Status: AC | PRN

## 2020-07-21 MED ORDER — ROCURONIUM BROMIDE 100 MG/10ML IV SOLN
INTRAVENOUS | Status: DC | PRN
  Administered 2020-07-21: 50 mg via INTRAVENOUS

## 2020-07-21 MED ORDER — DEXAMETHASONE SODIUM PHOSPHATE 10 MG/ML IJ SOLN
INTRAMUSCULAR | Status: AC
  Filled 2020-07-21: qty 1

## 2020-07-21 MED ORDER — PROMETHAZINE HCL 25 MG/ML IJ SOLN
INTRAMUSCULAR | Status: AC
  Filled 2020-07-21: qty 1

## 2020-07-21 MED ORDER — HYDROMORPHONE HCL 1 MG/ML IJ SOLN
0.2500 mg | INTRAMUSCULAR | Status: DC | PRN
  Administered 2020-07-21 (×2): 0.5 mg via INTRAVENOUS

## 2020-07-21 MED ORDER — MUPIROCIN 2 % EX OINT
TOPICAL_OINTMENT | CUTANEOUS | Status: DC | PRN
  Administered 2020-07-21: 1 via TOPICAL

## 2020-07-21 MED ORDER — LIDOCAINE 2% (20 MG/ML) 5 ML SYRINGE
INTRAMUSCULAR | Status: AC
  Filled 2020-07-21: qty 5

## 2020-07-21 MED ORDER — SUGAMMADEX SODIUM 500 MG/5ML IV SOLN
INTRAVENOUS | Status: DC | PRN
  Administered 2020-07-21: 400 mg via INTRAVENOUS

## 2020-07-21 MED ORDER — PROPOFOL 10 MG/ML IV BOLUS
INTRAVENOUS | Status: AC
  Filled 2020-07-21: qty 20

## 2020-07-21 MED ORDER — MEPERIDINE HCL 25 MG/ML IJ SOLN: 6.2500 mg | INTRAMUSCULAR | Status: DC | PRN

## 2020-07-21 MED ORDER — LACTATED RINGERS IV SOLN
INTRAVENOUS | Status: DC
  Administered 2020-07-21: 10 mL/h via INTRAVENOUS

## 2020-07-21 MED ORDER — MIDAZOLAM HCL 5 MG/5ML IJ SOLN
INTRAMUSCULAR | Status: DC | PRN
  Administered 2020-07-21: 2 mg via INTRAVENOUS

## 2020-07-21 MED ORDER — DEXAMETHASONE SODIUM PHOSPHATE 4 MG/ML IJ SOLN
INTRAMUSCULAR | Status: DC | PRN
  Administered 2020-07-21: 10 mg via INTRAVENOUS

## 2020-07-21 MED ORDER — CEFAZOLIN SODIUM-DEXTROSE 2-3 GM-%(50ML) IV SOLR
INTRAVENOUS | Status: DC | PRN
Start: 2020-07-21 — End: 2020-07-21
  Administered 2020-07-21: 2 g via INTRAVENOUS

## 2020-07-21 MED ORDER — OXYMETAZOLINE HCL 0.05 % NA SOLN
NASAL | Status: DC | PRN
  Administered 2020-07-21: 1 via TOPICAL

## 2020-07-21 SURGICAL SUPPLY — 39 items
ATTRACTOMAT 16X20 MAGNETIC DRP (DRAPES) IMPLANT
CANISTER SUCT 1200ML W/VALVE (MISCELLANEOUS) ×3 IMPLANT
COAGULATOR SUCT 8FR VV (MISCELLANEOUS) ×3 IMPLANT
COVER WAND RF STERILE (DRAPES) IMPLANT
DECANTER SPIKE VIAL GLASS SM (MISCELLANEOUS) IMPLANT
DRSG NASOPORE 8CM (GAUZE/BANDAGES/DRESSINGS) IMPLANT
DRSG TELFA 3X8 NADH (GAUZE/BANDAGES/DRESSINGS) IMPLANT
ELECT REM PT RETURN 9FT ADLT (ELECTROSURGICAL) ×3
ELECTRODE REM PT RTRN 9FT ADLT (ELECTROSURGICAL) ×1 IMPLANT
GLOVE BIO SURGEON STRL SZ7.5 (GLOVE) ×3 IMPLANT
GLOVE BIOGEL M 6.5 STRL (GLOVE) ×4 IMPLANT
GLOVE BIOGEL PI IND STRL 6.5 (GLOVE) IMPLANT
GLOVE BIOGEL PI IND STRL 7.0 (GLOVE) IMPLANT
GLOVE BIOGEL PI INDICATOR 6.5 (GLOVE) ×2
GLOVE BIOGEL PI INDICATOR 7.0 (GLOVE) ×4
GLOVE ECLIPSE 6.5 STRL STRAW (GLOVE) ×2 IMPLANT
GOWN STRL REUS W/ TWL LRG LVL3 (GOWN DISPOSABLE) ×2 IMPLANT
GOWN STRL REUS W/TWL LRG LVL3 (GOWN DISPOSABLE) ×12
NDL HYPO 25X1 1.5 SAFETY (NEEDLE) ×1 IMPLANT
NEEDLE HYPO 25X1 1.5 SAFETY (NEEDLE) ×3 IMPLANT
NS IRRIG 1000ML POUR BTL (IV SOLUTION) ×3 IMPLANT
PACK BASIN DAY SURGERY FS (CUSTOM PROCEDURE TRAY) ×3 IMPLANT
PACK ENT DAY SURGERY (CUSTOM PROCEDURE TRAY) ×3 IMPLANT
PAD DRESSING TELFA 3X8 NADH (GAUZE/BANDAGES/DRESSINGS) IMPLANT
SLEEVE SCD COMPRESS KNEE MED (MISCELLANEOUS) ×2 IMPLANT
SOLUTION BUTLER CLEAR DIP (MISCELLANEOUS) ×3 IMPLANT
SPLINT NASAL AIRWAY SILICONE (MISCELLANEOUS) ×3 IMPLANT
SPONGE GAUZE 2X2 8PLY STER LF (GAUZE/BANDAGES/DRESSINGS) ×1
SPONGE GAUZE 2X2 8PLY STRL LF (GAUZE/BANDAGES/DRESSINGS) ×2 IMPLANT
SPONGE NEURO XRAY DETECT 1X3 (DISPOSABLE) ×3 IMPLANT
SUT CHROMIC 4 0 P 3 18 (SUTURE) ×3 IMPLANT
SUT PLAIN 4 0 ~~LOC~~ 1 (SUTURE) ×3 IMPLANT
SUT PROLENE 3 0 PS 2 (SUTURE) ×3 IMPLANT
SUT VIC AB 4-0 P-3 18XBRD (SUTURE) IMPLANT
SUT VIC AB 4-0 P3 18 (SUTURE)
TOWEL GREEN STERILE FF (TOWEL DISPOSABLE) ×3 IMPLANT
TUBE SALEM SUMP 12R W/ARV (TUBING) IMPLANT
TUBE SALEM SUMP 16 FR W/ARV (TUBING) ×3 IMPLANT
YANKAUER SUCT BULB TIP NO VENT (SUCTIONS) ×3 IMPLANT

## 2020-07-21 NOTE — Anesthesia Procedure Notes (Signed)
Procedure Name: Intubation Performed by: Verita Lamb, CRNA Pre-anesthesia Checklist: Patient identified, Emergency Drugs available, Suction available and Patient being monitored Patient Re-evaluated:Patient Re-evaluated prior to induction Oxygen Delivery Method: Circle system utilized Preoxygenation: Pre-oxygenation with 100% oxygen Induction Type: IV induction Ventilation: Mask ventilation without difficulty Laryngoscope Size: Mac and 4 Grade View: Grade I Tube type: Oral Tube size: 7.0 mm Number of attempts: 1 Airway Equipment and Method: Stylet and Oral airway Placement Confirmation: ETT inserted through vocal cords under direct vision,  positive ETCO2,  breath sounds checked- equal and bilateral and CO2 detector Secured at: 24 cm Tube secured with: Tape Dental Injury: Teeth and Oropharynx as per pre-operative assessment  Difficulty Due To: Difficulty was anticipated Comments: Smooth iv induction, unable to mask due to beard.  Rsi.  glidescope as back up due to descriptions of sleep apnea type symptoms.  Grade I view mac 4 blade.  Bbs, clear. mkelly

## 2020-07-21 NOTE — Anesthesia Postprocedure Evaluation (Signed)
Anesthesia Post Note  Patient: Thomas Gutierrez  Procedure(s) Performed: NASAL SEPTOPLASTY WITH BILATERAL  TURBINATE REDUCTION (Bilateral Nose)     Patient location during evaluation: PACU Anesthesia Type: General Level of consciousness: sedated and patient cooperative Pain management: pain level controlled Vital Signs Assessment: post-procedure vital signs reviewed and stable Respiratory status: spontaneous breathing Cardiovascular status: stable Anesthetic complications: no   No complications documented.  Last Vitals:  Vitals:   07/21/20 1100 07/21/20 1105  BP: (!) 153/102 (!) 160/106  Pulse: 80   Resp:    Temp:    SpO2: 95% 95%    Last Pain:  Vitals:   07/21/20 1045  TempSrc:   PainSc: [name]

## 2020-07-21 NOTE — H&P (Signed)
Cc: Chronic nasal obstruction  HPI: The patient is a 54 y/o male who returns today for follow up evaluation of chronic nasal congestion. The patient was last seen 4 weeks ago. At that time, he was noted to have a bidirectional septal deviation with bilateral inferior turbinate hypertrophy. The patient was placed on daily Flonase. He returns today noting no improvement in his congestion. He currently denies facial pain or pressure.   Exam: The nasal cavities were decongested and anesthetised with a combination of oxymetazoline and 4% lidocaine solution. The flexible scope was inserted into the right nasal cavity. NSD noted. Endoscopy of the interior nasal cavity, superior, inferior, and middle meatus was performed. The sphenoid-ethmoid recess was examined. Edematous mucosa was noted. No polyp, mass, or lesion was appreciated. Olfactory cleft was clear. Nasopharynx was clear. Turbinates were hypertrophied but without mass. Incomplete response to decongestion. The procedure was repeated on the contralateral side with similar findings. The patient tolerated the procedure well. Instructions were given to avoid eating or drinking for 2 hours.  Assessment: 1. 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 This results in significant nasal obstruction.   Plan:   1. Nasal endoscopy findings are reviewed with the patient.  2. The patient would likely benefit from septoplasty and bilateral inferior turbinate reduction. The risks, benefits, details, and alternatives of the procedure are discussed with the patient. Questions are invited and answered. 3. The patient would like to proceed with the procedure.

## 2020-07-21 NOTE — Op Note (Signed)
DATE OF PROCEDURE: 07/21/2020  OPERATIVE REPORT   SURGEON: Leta Baptist, MD   PREOPERATIVE DIAGNOSES:  1. Severe nasal septal deviation.  2. Bilateral inferior turbinate hypertrophy.  3. Chronic nasal obstruction.  POSTOPERATIVE DIAGNOSES:  1. Severe nasal septal deviation.  2. Bilateral inferior turbinate hypertrophy.  3. Chronic nasal obstruction.  PROCEDURE PERFORMED:  1. Septoplasty.  2. Bilateral partial inferior turbinate resection.   ANESTHESIA: General endotracheal tube anesthesia.   COMPLICATIONS: None.   ESTIMATED BLOOD LOSS: 100  mL.   INDICATION FOR PROCEDURE: Thomas Gutierrez is a 54 y.o. male with a history of chronic nasal obstruction. The patient was treated with antihistamine, decongestant, and steroid nasal sprays. However, the patient continued to be symptomatic. On examination, the patient was noted to have bilateral severe inferior turbinate hypertrophy and significant nasal septal deviation, causing significant nasal obstruction. Based on the above findings, the decision was made for the patient to undergo the above-stated procedures. The risks, benefits, alternatives, and details of the procedures were discussed with the patient. Questions were invited and answered. Informed consent was obtained.   DESCRIPTION OF PROCEDURE: The patient was taken to the operating room and placed supine on the operating table. General endotracheal tube anesthesia was administered by the anesthesiologist. The patient was positioned, and prepped and draped in the standard fashion for nasal surgery. Pledgets soaked with Afrin were placed in both nasal cavities for decongestion. The pledgets were subsequently removed.   Examination of the nasal cavity revealed a severe nasal septal deviation. 1% lidocaine with 1:100,000 epinephrine was injected onto the nasal septum bilaterally. A hemitransfixion incision was made on the left side. The mucosal flap was carefully elevated on the left side. A  cartilaginous incision was made 1 cm superior to the caudal margin of the nasal septum. Mucosal flap was also elevated on the right side in the similar fashion. It should be noted that due to the severe septal deviation, the deviated portion of the cartilaginous and bony septum had to be removed in piecemeal fashion. Once the deviated portions were removed, a straight midline septum was achieved. The septum was then quilted with 4-0 plain gut sutures. The hemitransfixion incision was closed with interrupted 4-0 chromic sutures.   The inferior one half of both hypertrophied inferior turbinate was crossclamped with a Kelly clamp. The inferior one half of each inferior turbinate was then resected with a pair of cross cutting scissors. Hemostasis was achieved with a suction cautery device.  Doyle splints were applied to the nasal septum.  The care of the patient was turned over to the anesthesiologist. The patient was awakened from anesthesia without difficulty. The patient was extubated and transferred to the recovery room in good condition.   OPERATIVE FINDINGS: Severe nasal septal deviation and bilateral inferior turbinate hypertrophy.   SPECIMEN: None.   FOLLOWUP CARE: The patient be discharged home once he is awake and alert. The patient will be placed on Percocet p.r.n. pain, and amoxicillin 875 mg p.o. b.i.d. for 3 days. The patient will follow up in my office in 3 days for splint removal.   Thomas Spanbauer Raynelle Bring, MD

## 2020-07-21 NOTE — Anesthesia Preprocedure Evaluation (Addendum)
Anesthesia Evaluation  Patient identified by MRN, date of birth, ID band Patient awake    Reviewed: Allergy & Precautions, NPO status , Patient's Chart, lab work & pertinent test results, Unable to perform ROS - Chart review only  Airway Mallampati: II  TM Distance: >3 FB Neck ROM: Full    Dental  (+) Dental Advisory Given, Teeth Intact,    Pulmonary neg pulmonary ROS,    Pulmonary exam normal breath sounds clear to auscultation       Cardiovascular negative cardio ROS Normal cardiovascular exam Rhythm:Regular Rate:Normal     Neuro/Psych CVA negative psych ROS   GI/Hepatic negative GI ROS, Neg liver ROS,   Endo/Other  negative endocrine ROS  Renal/GU negative Renal ROS     Musculoskeletal negative musculoskeletal ROS (+)   Abdominal   Peds  Hematology negative hematology ROS (+)   Anesthesia Other Findings   Reproductive/Obstetrics                            Anesthesia Physical Anesthesia Plan  ASA: II  Anesthesia Plan: General   Post-op Pain Management:    Induction: Intravenous  PONV Risk Score and Plan: 4 or greater and Dexamethasone, Ondansetron, Midazolam and Treatment may vary due to age or medical condition  Airway Management Planned: Oral ETT  Additional Equipment: None  Intra-op Plan:   Post-operative Plan: Extubation in OR  Informed Consent: I have reviewed the patients History and Physical, chart, labs and discussed the procedure including the risks, benefits and alternatives for the proposed anesthesia with the patient or authorized representative who has indicated his/her understanding and acceptance.     Dental advisory given  Plan Discussed with: CRNA  Anesthesia Plan Comments:        Anesthesia Quick Evaluation

## 2020-07-21 NOTE — Discharge Instructions (Addendum)
Post Anesthesia Home Care Instructions  Activity: Get plenty of rest for the remainder of the day. A responsible individual must stay with you for 24 hours following the procedure.  For the next 24 hours, DO NOT: -Drive a car -Operate machinery -Drink alcoholic beverages -Take any medication unless instructed by your physician -Make any legal decisions or sign important papers.  Meals: Start with liquid foods such as gelatin or soup. Progress to regular foods as tolerated. Avoid greasy, spicy, heavy foods. If nausea and/or vomiting occur, drink only clear liquids until the nausea and/or vomiting subsides. Call your physician if vomiting continues.  Special Instructions/Symptoms: Your throat may feel dry or sore from the anesthesia or the breathing tube placed in your throat during surgery. If this causes discomfort, gargle with warm salt water. The discomfort should disappear within 24 hours.  If you had a scopolamine patch placed behind your ear for the management of post- operative nausea and/or vomiting:  1. The medication in the patch is effective for 72 hours, after which it should be removed.  Wrap patch in a tissue and discard in the trash. Wash hands thoroughly with soap and water. 2. You may remove the patch earlier than 72 hours if you experience unpleasant side effects which may include dry mouth, dizziness or visual disturbances. 3. Avoid touching the patch. Wash your hands with soap and water after contact with the patch.    -----------------  POSTOPERATIVE INSTRUCTIONS FOR PATIENTS HAVING NASAL OR SINUS OPERATIONS ACTIVITY: Restrict activity at home for the first two days, resting as much as possible. Light activity is best. You may usually return to work within a week. You should refrain from nose blowing, strenuous activity, or heavy lifting greater than 20lbs for a total of one week after your operation.  If sneezing cannot be avoided, sneeze with your mouth  open. DISCOMFORT: You may experience a dull headache and pressure along with nasal congestion and discharge. These symptoms may be worse during the first week after the operation but may last as long as two to four weeks.  Please take Tylenol or the pain medication that has been prescribed for you. Do not take aspirin or aspirin containing medications since they may cause bleeding.  You may experience symptoms of post nasal drainage, nasal congestion, headaches and fatigue for two or three months after your operation.  BLEEDING: You may have some blood tinged nasal drainage for approximately two weeks after the operation.  The discharge will be worse for the first week.  Please call our office at [phone] or go to the nearest hospital emergency room if you experience any of the following: heavy, bright red blood from your nose or mouth that lasts longer than 15 minutes or coughing up or vomiting bright red blood or blood clots. GENERAL CONSIDERATIONS: 1. A gauze dressing will be placed on your upper lip to absorb any drainage after the operation. You may need to change this several times a day.  If you do not have very much drainage, you may remove the dressing.  Remember that you may gently wipe your nose with a tissue and sniff in, but DO NOT blow your nose. 2. Please keep all of your postoperative appointments.  Your final results after the operation will depend on proper follow-up.  The initial visit is usually 2 to 5 days after the operation.  During this visit, the remaining nasal packing and internal septal splints will be removed.  Your nasal and sinus cavities will   be cleaned.  During the second visit, your nasal and sinus cavities will be cleaned again. Have someone drive you to your first two postoperative appointments.  3. How you care for your nose after the operation will influence the results that you obtain.  You should follow all directions, take your medication as prescribed, and call  our office [phone] with any problems or questions. 4. You may be more comfortable sleeping with your head elevated on two pillows. 5. Do not take any medications that we have not prescribed or recommended. WARNING SIGNS: if any of the following should occur, please call our office: 1. Persistent fever greater than 102F. 2. Persistent vomiting. 3. Severe and constant pain that is not relieved by prescribed pain medication. 4. Trauma to the nose. 5. Rash or unusual side effects from any medicines.  

## 2020-07-21 NOTE — Transfer of Care (Signed)
Immediate Anesthesia Transfer of Care Note  Patient: Thomas Gutierrez  Procedure(s) Performed: NASAL SEPTOPLASTY WITH BILATERAL  TURBINATE REDUCTION (Bilateral Nose)  Patient Location: PACU  Anesthesia Type:General  Level of Consciousness: awake, alert  and oriented  Airway & Oxygen Therapy: Patient Spontanous Breathing and Patient connected to face mask oxygen  Post-op Assessment: Report given to RN and Post -op Vital signs reviewed and stable  Post vital signs: Reviewed and stable  Last Vitals:  Vitals Value Taken Time  BP    Temp    Pulse 88 07/21/20 0917  Resp 8 07/21/20 0917  SpO2 98 % 07/21/20 0917  Vitals shown include unvalidated device data.  Last Pain:  Vitals:   07/21/20 0654  TempSrc: Oral  PainSc: 0-No pain         Complications: No complications documented.

## 2020-07-22 ENCOUNTER — Encounter (HOSPITAL_BASED_OUTPATIENT_CLINIC_OR_DEPARTMENT_OTHER): Payer: Self-pay | Admitting: Otolaryngology

## 2020-10-16 ENCOUNTER — Ambulatory Visit: Payer: Medicaid Other | Admitting: Nurse Practitioner

## 2020-11-18 ENCOUNTER — Ambulatory Visit: Payer: Medicaid Other | Admitting: Family Medicine

## 2020-11-18 ENCOUNTER — Other Ambulatory Visit: Payer: Self-pay

## 2020-11-18 VITALS — BP 130/98 | HR 85 | Temp 97.5°F | Ht 74.0 in | Wt 212.0 lb

## 2020-11-18 DIAGNOSIS — N529 Male erectile dysfunction, unspecified: Secondary | ICD-10-CM

## 2020-11-18 DIAGNOSIS — R399 Unspecified symptoms and signs involving the genitourinary system: Secondary | ICD-10-CM

## 2020-11-18 DIAGNOSIS — Z1211 Encounter for screening for malignant neoplasm of colon: Secondary | ICD-10-CM | POA: Diagnosis not present

## 2020-11-18 DIAGNOSIS — R030 Elevated blood-pressure reading, without diagnosis of hypertension: Secondary | ICD-10-CM

## 2020-11-18 MED ORDER — MELOXICAM 15 MG PO TABS
15.0000 mg | ORAL_TABLET | Freq: Every day | ORAL | 3 refills | Status: DC
Start: 2020-11-18 — End: 2021-01-07

## 2020-11-18 MED ORDER — SILDENAFIL CITRATE 100 MG PO TABS: 50.0000 mg | ORAL_TABLET | Freq: Every day | ORAL | 11 refills | Status: DC | PRN

## 2020-11-18 NOTE — Progress Notes (Signed)
Subjective:    Patient ID: Thomas Gutierrez, male    DOB: Oct 10, 1966, 54 y.o.   MRN: [id number]  HPI Patient is a very pleasant 54 year old Caucasian male who presents today for a checkup.  He complains of some pain in his neck.  He had an MRI of his cervical spine performed in March of this year.  He did have some mild degenerative disc disease at 2 separate levels.  Thankfully there was no significant nerve impingement.  There was also no significant arthritis seen.  He reports pain with range of motion and also limited range of motion particularly troublesome when he is trying to back up his truck or back a trailer.  He has not tried any physical therapy.  He is not tried any anti-inflammatories yet.  He also reports some mild erectile dysfunction.  He also reports nocturia.  He states that he will have to wake up 2-3 times every evening to urinate.  He reports a weak stream.  His PSA was checked in April of this year and was outstanding at 0.3 however his lower urinary tract symptoms sound consistent with BPH.  At this time he is not interested in trying Flomax.  He is overdue for a colonoscopy and after discussing this he is willing to proceed with a colonoscopy.  He is also overdue to check his cholesterol.  His blood pressure today is elevated as it was recently had a neurology appointment.  However he eats a large amount of sodium.  He would like to try to reduce his salt to see if this will help bring down his blood pressure.  He would also like to start checking his blood pressure at home to see if this is consistent at home. Past Medical History:  Diagnosis Date  . Vertigo    Past Surgical History:  Procedure Laterality Date  . NASAL SEPTOPLASTY W/ TURBINOPLASTY Bilateral 07/21/2020   Procedure: NASAL SEPTOPLASTY WITH BILATERAL  TURBINATE REDUCTION;  Surgeon: Leta Baptist, MD;  Location: [facility];  Service: ENT;  Laterality: Bilateral;   Current Outpatient Medications on File  Prior to Visit  Medication Sig Dispense Refill  . Acetaminophen (TYLENOL PO) Take by mouth. OTC for headaches as needed (Patient not taking: Reported on 11/18/2020)    . Aspirin-Caffeine 845-65 MG PACK Take by mouth as needed. For headaches (Patient not taking: Reported on 11/18/2020)    . Multiple Vitamins-Minerals (MULTIVITAMIN ADULT) CHEW Chew by mouth. (Patient not taking: Reported on 11/18/2020)    . mupirocin ointment (BACTROBAN) 2 % Apply 1 application topically 2 (two) times daily. (Patient not taking: Reported on 11/18/2020) 22 g 0  . rizatriptan (MAXALT-MLT) 10 MG disintegrating tablet Take 1 tablet (10 mg total) by mouth as needed for migraine. May repeat in 2 hours if needed (Patient not taking: Reported on 11/18/2020) 9 tablet 11   No current facility-administered medications on file prior to visit.   Allergies  Allergen Reactions  . Haldol [Haloperidol Lactate] Other (See Comments)    REACTION: Muscle tightness (tension) all over body for 6 hours.    Social History   Socioeconomic History  . Marital status: Single    Spouse name: Not on file  . Number of children: 5  . Years of education: Not on file  . Highest education level: High school graduate  Occupational History    Comment: NA  Tobacco Use  . Smoking status: Never Smoker  . Smokeless tobacco: Current User    Types:  Snuff  Vaping Use  . Vaping Use: Never used  Substance and Sexual Activity  . Alcohol use: No  . Drug use: No  . Sexual activity: Yes  Other Topics Concern  . Not on file  Social History Narrative   Lives with sig other x 27 years   Caffeine- sweet tea all day   I hike the [employer], play basketball   Social Determinants of Health   Financial Resource Strain:   . Difficulty of Paying Living Expenses: Not on file  Food Insecurity:   . Worried About Charity fundraiser in the Last Year: Not on file  . Ran Out of Food in the Last Year: Not on file  Transportation Needs:   . Lack  of Transportation (Medical): Not on file  . Lack of Transportation (Non-Medical): Not on file  Physical Activity:   . Days of Exercise per Week: Not on file  . Minutes of Exercise per Session: Not on file  Stress:   . Feeling of Stress : Not on file  Social Connections:   . Frequency of Communication with Friends and Family: Not on file  . Frequency of Social Gatherings with Friends and Family: Not on file  . Attends Religious Services: Not on file  . Active Member of Clubs or Organizations: Not on file  . Attends Archivist Meetings: Not on file  . Marital Status: Not on file  Intimate Partner Violence:   . Fear of Current or Ex-Partner: Not on file  . Emotionally Abused: Not on file  . Physically Abused: Not on file  . Sexually Abused: Not on file      Review of Systems  All other systems reviewed and are negative.      Objective:   Physical Exam Vitals reviewed.  Constitutional:      Appearance: Normal appearance. He is normal weight.  Cardiovascular:     Rate and Rhythm: Normal rate and regular rhythm.     Heart sounds: Normal heart sounds. No murmur heard.  No friction rub. No gallop.   Pulmonary:     Effort: Pulmonary effort is normal. No respiratory distress.     Breath sounds: Normal breath sounds. No wheezing, rhonchi or rales.  Musculoskeletal:     Right lower leg: No edema.     Left lower leg: No edema.  Neurological:     Mental Status: He is alert.           Assessment & Plan:  Colon cancer screening - Plan: Ambulatory referral to Gastroenterology  Elevated blood pressure reading - Plan: Lipid panel  Erectile dysfunction, unspecified erectile dysfunction type  Lower urinary tract symptoms (LUTS)   Patient agrees to see gastroenterology for colonoscopy and so I will schedule this for him.  His blood pressure today is elevated so have asked him to decrease salt in his diet and start checking his blood pressure frequently at home.  He  can notify me of the values and if consistently elevated we could discuss options to help bring down his blood pressure.  We also discussed Flomax for lower urinary tract symptoms and he politely declines this at the present time but he would be willing to try sildenafil 50 to 100 mg p.o. daily as needed erectile dysfunction.  Otherwise he is doing well with no concerns.  He refuses all vaccinations.

## 2020-11-19 LAB — LIPID PANEL
Cholesterol: 170 mg/dL (ref <200)
HDL: 36 mg/dL — ABNORMAL LOW (ref >=40)
LDL Cholesterol (Calc): 107 mg/dL (calc) — ABNORMAL HIGH
Non-HDL Cholesterol (Calc): 134 mg/dL (calc) — ABNORMAL HIGH (ref <130)
Total CHOL/HDL Ratio: 4.7 (calc) (ref <5.0)
Triglycerides: 157 mg/dL — ABNORMAL HIGH (ref <150)

## 2020-11-21 ENCOUNTER — Encounter: Payer: Self-pay | Admitting: Internal Medicine

## 2021-01-06 ENCOUNTER — Ambulatory Visit: Payer: Medicaid Other

## 2021-01-07 ENCOUNTER — Ambulatory Visit: Payer: Medicaid Other

## 2021-01-07 ENCOUNTER — Other Ambulatory Visit: Payer: Self-pay

## 2021-01-07 ENCOUNTER — Ambulatory Visit (INDEPENDENT_AMBULATORY_CARE_PROVIDER_SITE_OTHER): Payer: Self-pay | Admitting: *Deleted

## 2021-01-07 VITALS — Ht 74.0 in | Wt 218.4 lb

## 2021-01-07 DIAGNOSIS — Z1211 Encounter for screening for malignant neoplasm of colon: Secondary | ICD-10-CM

## 2021-01-07 MED ORDER — NA SULFATE-K SULFATE-MG SULF 17.5-3.13-1.6 GM/177ML PO SOLN
1.0000 | Freq: Once | ORAL | 0 refills | Status: AC
Start: 2021-01-07 — End: 2021-01-07

## 2021-01-07 NOTE — Progress Notes (Signed)
Gastroenterology Pre-Procedure Review  Request Date: 01/07/2021 Requesting Physician: Dr. Dennard Schaumann @ [facility], no previous TCS  PATIENT REVIEW QUESTIONS: The patient responded to the following health history questions as indicated:    1. Diabetes Melitis: no 2. Joint replacements in the past 12 months: no 3. Major health problems in the past 3 months: no 4. Has an artificial valve or MVP: no 5. Has a defibrillator: no 6. Has been advised in past to take antibiotics in advance of a procedure like teeth cleaning: no 7. Family history of colon cancer: no  8. Alcohol Use: no 9. Illicit drug Use: no 10. History of sleep apnea: no 11. History of coronary artery or other vascular stents placed within the last 12 months: no 12. History of any prior anesthesia complications: no 13. Body mass index is 28.04 kg/m.    MEDICATIONS & ALLERGIES:    Patient reports the following regarding taking any blood thinners:   Plavix? no Aspirin? no Coumadin? no Brilinta? no Xarelto? no Eliquis? no Pradaxa? no Savaysa? no Effient? no  Patient confirms/reports the following medications:  Current Outpatient Medications  Medication Sig Dispense Refill  . meloxicam (MOBIC) 15 MG tablet Take 15 mg by mouth daily.     No current facility-administered medications for this visit.    Patient confirms/reports the following allergies:  Allergies  Allergen Reactions  . Haldol [Haloperidol Lactate] Other (See Comments)    REACTION: Muscle tightness (tension) all over body for 6 hours.     No orders of the defined types were placed in this encounter.   AUTHORIZATION INFORMATION Primary Insurance: [place] [employer] [place],  [place] #:[id number] ,  Group #: [id number] Pre-Cert / Auth required:  Pre-Cert / Auth #:   SCHEDULE INFORMATION: Procedure has been scheduled as follows:  Date: 01/26/2021, Time: 12:00 Location: [facility] with Dr. Abbey Chatters  This Gastroenterology Pre-Precedure Review Form  is being routed to the following provider(s): Roseanne Kaufman, NP

## 2021-01-07 NOTE — Patient Instructions (Signed)
Thomas Gutierrez  1966/06/22 MRN: [id number]     Procedure Date: 01/26/2021 Time to register: 10:30 am  Place to register: [facility] Procedure Time: 12:00 pm Scheduled provider: Dr. Abbey Chatters    PREPARATION FOR COLONOSCOPY WITH SUPREP BOWEL PREP KIT  Note: Suprep Bowel Prep Kit is a split-dose (2day) regimen. Consumption of BOTH 6-ounce bottles is required for a complete prep.  Please notify us immediately if you are diabetic, take iron supplements, or if you are on Coumadin or any other blood thinners.  Please hold the following medications: n/a                                                                                                                                                  2 DAYS BEFORE PROCEDURE:  DATE: 01/24/2021   DAY: Saturday Begin clear liquid diet AFTER your lunch meal. NO SOLID FOODS after this point.  1 DAY BEFORE PROCEDURE:  DATE: 01/25/2021   DAY: Sunday Continue clear liquids the entire day - NO SOLID FOOD.   Diabetic medications adjustments for today: n/a  At 6:00pm: Complete steps 1 through 4 below, using ONE (1) 6-ounce bottle, before going to bed. Step 1:  Pour ONE (1) 6-ounce bottle of SUPREP liquid into the mixing container.  Step 2:  Add cool drinking water to the 16 ounce line on the container and mix.  Note: Dilute the solution concentrate as directed prior to use. Step 3:  DRINK ALL the liquid in the container. Step 4:  You MUST drink an additional two (2) or more 16 ounce containers of water over the next one (1) hour.   Continue clear liquids.  DAY OF PROCEDURE:   DATE: 01/26/2021   DAY: Monday If you take medications for your heart, blood pressure, or breathing, you may take these medications.  Diabetic medications adjustments for today: n/a  5 hours before your procedure at :  7:00 am Step 1:  Pour ONE (1) 6-ounce bottle of SUPREP liquid into the mixing container.  Step 2:  Add cool drinking water to the 16 ounce line on the  container and mix.  Note: Dilute the solution concentrate as directed prior to use. Step 3:  DRINK ALL the liquid in the container. Step 4:  You MUST drink an additional two (2) or more 16 ounce containers of water over the next one (1) hour. You MUST complete the final glass of water at least 3 hours before your colonoscopy. Nothing by mouth past 9:00 am.  You may take your morning medications with sip of water unless we have instructed otherwise.    Please see below for Dietary Information.  CLEAR LIQUIDS INCLUDE:  Water Jello (NOT red in color)   Ice Popsicles (NOT red in color)   Tea (sugar ok, no milk/cream) Powdered fruit flavored drinks  Coffee (  sugar ok, no milk/cream) Gatorade/ Lemonade/ Kool-Aid  (NOT red in color)   Juice: apple, white grape, white cranberry Soft drinks  Clear bullion, consomme, broth (fat free beef/chicken/vegetable)  Carbonated beverages (any kind)  Strained chicken noodle soup Hard Candy   Remember: Clear liquids are liquids that will allow you to see your fingers on the other side of a clear glass. Be sure liquids are NOT red in color, and not cloudy, but CLEAR.  DO NOT EAT OR DRINK ANY OF THE FOLLOWING:  Dairy products of any kind   Cranberry juice Tomato juice / V8 juice   Grapefruit juice Orange juice     Red grape juice  Do not eat any solid foods, including such foods as: cereal, oatmeal, yogurt, fruits, vegetables, creamed soups, eggs, bread, crackers, pureed foods in a blender, etc.   HELPFUL HINTS FOR DRINKING PREP SOLUTION:   Make sure prep is extremely cold. Mix and refrigerate the the morning of the prep. You may also put in the freezer.   You may try mixing some Crystal Light or Country Time Lemonade if you prefer. Mix in small amounts; add more if necessary.  Try drinking through a straw  Rinse mouth with water or a mouthwash between glasses, to remove after-taste.  Try sipping on a cold beverage /ice/ popsicles between glasses of  prep.  Place a piece of sugar-free hard candy in mouth between glasses.  If you become nauseated, try consuming smaller amounts, or stretch out the time between glasses. Stop for 30-60 minutes, then slowly start back drinking.     OTHER INSTRUCTIONS  You will need a responsible adult at least 55 years of age to accompany you and drive you home. This person must remain in the waiting room during your procedure. The hospital will cancel your procedure if you do not have a responsible adult with you.   1. Wear loose fitting clothing that is easily removed. 2. Leave jewelry and other valuables at home.  3. Remove all body piercing jewelry and leave at home. 4. Total time from sign-in until discharge is approximately 2-3 hours. 5. You should go home directly after your procedure and rest. You can resume normal activities the day after your procedure. 6. The day of your procedure you should not:  Drive  Make legal decisions  Operate machinery  Drink alcohol  Return to work   You may call the office (Dept: [phone]) before 5:00pm, or page the doctor on call [phone]) after 5:00pm, for further instructions, if necessary.   Insurance Information YOU WILL NEED TO CHECK WITH YOUR INSURANCE COMPANY FOR THE BENEFITS OF COVERAGE YOU HAVE FOR THIS PROCEDURE.  UNFORTUNATELY, NOT ALL INSURANCE COMPANIES HAVE BENEFITS TO COVER ALL OR PART OF THESE TYPES OF PROCEDURES.  IT IS YOUR RESPONSIBILITY TO CHECK YOUR BENEFITS, HOWEVER, WE WILL BE GLAD TO ASSIST YOU WITH ANY CODES YOUR INSURANCE COMPANY MAY NEED.    PLEASE NOTE THAT MOST INSURANCE COMPANIES WILL NOT COVER A SCREENING COLONOSCOPY FOR PEOPLE UNDER THE AGE OF 50  IF YOU HAVE BCBS INSURANCE, YOU MAY HAVE BENEFITS FOR A SCREENING COLONOSCOPY BUT IF POLYPS ARE FOUND THE DIAGNOSIS WILL CHANGE AND THEN YOU MAY HAVE A DEDUCTIBLE THAT WILL NEED TO BE MET. SO PLEASE MAKE SURE YOU CHECK YOUR BENEFITS FOR A SCREENING COLONOSCOPY AS WELL AS A  DIAGNOSTIC COLONOSCOPY.

## 2021-01-08 NOTE — Progress Notes (Signed)
ASA 2. Appropriate.  ?

## 2021-01-20 ENCOUNTER — Telehealth: Payer: Self-pay | Admitting: *Deleted

## 2021-01-20 NOTE — Telephone Encounter (Signed)
Pt called and requested to cancel his procedure on 01/26/2021.  He said that he has been exposed to Covid.  He did not wish to reschedule right now due to future obligations.  Informed Carolyn in Endo.

## 2021-01-23 ENCOUNTER — Other Ambulatory Visit (HOSPITAL_COMMUNITY): Payer: Medicaid Other

## 2021-01-26 ENCOUNTER — Encounter (HOSPITAL_COMMUNITY): Admission: RE | Payer: Self-pay | Source: Ambulatory Visit

## 2021-01-26 ENCOUNTER — Ambulatory Visit (HOSPITAL_COMMUNITY): Admission: RE | Admit: 2021-01-26 | Payer: Medicaid Other | Source: Ambulatory Visit

## 2021-01-26 SURGERY — COLONOSCOPY WITH PROPOFOL
Anesthesia: Monitor Anesthesia Care

## 2021-03-13 ENCOUNTER — Ambulatory Visit: Payer: Medicaid Other | Admitting: Family Medicine

## 2021-03-13 ENCOUNTER — Other Ambulatory Visit: Payer: Self-pay

## 2021-03-13 ENCOUNTER — Encounter: Payer: Self-pay | Admitting: Family Medicine

## 2021-03-13 VITALS — BP 130/76 | HR 100 | Temp 97.9°F | Resp 14 | Ht 74.0 in | Wt 212.0 lb

## 2021-03-13 DIAGNOSIS — G5601 Carpal tunnel syndrome, right upper limb: Secondary | ICD-10-CM

## 2021-03-13 DIAGNOSIS — M542 Cervicalgia: Secondary | ICD-10-CM | POA: Diagnosis not present

## 2021-03-13 MED ORDER — PREDNISONE 20 MG PO TABS
ORAL_TABLET | ORAL | 0 refills | Status: DC
Start: 2021-03-13 — End: 2021-04-27

## 2021-03-13 MED ORDER — TIZANIDINE HCL 4 MG PO TABS
4.0000 mg | ORAL_TABLET | Freq: Four times a day (QID) | ORAL | 0 refills | Status: DC | PRN
Start: 2021-03-13 — End: 2021-05-28

## 2021-03-13 NOTE — Progress Notes (Signed)
Subjective:    Patient ID: Thomas Gutierrez, male    DOB: May 15, 1966, 55 y.o.   MRN: [id number]  HPI MRI c-spine 02/2020- FINDINGS: Alignment: Upper cervical reversal of lordosis.  Vertebrae: No fracture, evidence of discitis, or aggressive bone lesion. Incidental hemangiomas noted at T2.  Cord: Normal signal and morphology.  Posterior Fossa, vertebral arteries, paraspinal tissues: Negative. No evidence of inflammation or mass.  Disc levels:  C2-3: Unremarkable.  C3-4: Minor bulging or central protrusion. No facet spurring or impingement  C4-5: Unremarkable.  C5-6: Mild disc narrowing and ridging. Mild disc bulging. No impingement  C6-7: Unremarkable.  C7-T1:Unremarkable.   Recently, the patient was hiking.  He walked into a low-lying branch that forcibly hyperextended his neck.  Ever since that time he has had pain on the right side of his neck.  He has numbness and tingling radiating down his right arm.  However the numbness and tingling begins around the level of the elbow and radiates into his thumb, index finger, and middle finger.  He reports decreasing grip strength.  He is dropping objects due to lack of dexterity.  He has a positive Tinel's sign today.  He has a weakly positive Phalen sign.  However he has no weakness in his biceps or triceps and his biceps reflex and brachioradialis reflex are normal. Past Medical History:  Diagnosis Date  . Vertigo    Past Surgical History:  Procedure Laterality Date  . NASAL SEPTOPLASTY W/ TURBINOPLASTY Bilateral 07/21/2020   Procedure: NASAL SEPTOPLASTY WITH BILATERAL  TURBINATE REDUCTION;  Surgeon: Leta Baptist, MD;  Location: [facility];  Service: ENT;  Laterality: Bilateral;   Current Outpatient Medications on File Prior to Visit  Medication Sig Dispense Refill  . Ibuprofen-Acetaminophen (ADVIL DUAL ACTION PO) Take by mouth.    . meloxicam (MOBIC) 15 MG tablet Take 15 mg by mouth daily.    .  rizatriptan (MAXALT) 10 MG tablet Take 10 mg by mouth as needed for migraine. May repeat in 2 hours if needed     No current facility-administered medications on file prior to visit.   Allergies  Allergen Reactions  . Haldol [Haloperidol Lactate] Other (See Comments)    REACTION: Muscle tightness (tension) all over body for 6 hours.    Social History   Socioeconomic History  . Marital status: Single    Spouse name: Not on file  . Number of children: 5  . Years of education: Not on file  . Highest education level: High school graduate  Occupational History    Comment: NA  Tobacco Use  . Smoking status: Never Smoker  . Smokeless tobacco: Current User    Types: Snuff  Vaping Use  . Vaping Use: Never used  Substance and Sexual Activity  . Alcohol use: No  . Drug use: No  . Sexual activity: Yes  Other Topics Concern  . Not on file  Social History Narrative   Lives with sig other x 27 years   Caffeine- sweet tea all day   I hike the [employer], play basketball   Social Determinants of Health   Financial Resource Strain: Not on file  Food Insecurity: Not on file  Transportation Needs: Not on file  Physical Activity: Not on file  Stress: Not on file  Social Connections: Not on file  Intimate Partner Violence: Not on file      Review of Systems  All other systems reviewed and are negative.      Objective:  Physical Exam Vitals reviewed.  Constitutional:      Appearance: Normal appearance. He is normal weight.  Cardiovascular:     Rate and Rhythm: Normal rate and regular rhythm.     Heart sounds: Normal heart sounds. No murmur heard. No friction rub. No gallop.   Pulmonary:     Effort: Pulmonary effort is normal. No respiratory distress.     Breath sounds: Normal breath sounds. No wheezing, rhonchi or rales.  Musculoskeletal:     Right lower leg: No edema.     Left lower leg: No edema.  Neurological:     Mental Status: He is alert.            Assessment & Plan:  Neck pain  Carpal tunnel syndrome of right wrist  I believe the patient suffered a whiplash injury to his neck.  I will treat this with a prednisone taper pack due to inflammation and also Zanaflex as needed for muscle spasms.  I believe this should resolve gradually on its own.  However I believe the numbness in his right arm is more likely due to carpal tunnel syndrome and has nothing to do with a neck injury.  I recommended nerve conduction studies to evaluate once and for all to determine the cause of the paresthesias in his right arm as he is previously reported this in the past and his MRI was unremarkable.  Therefore I will order the nerve conduction studies and likely refer to a hand specialist if carpal tunnel is found

## 2021-03-24 ENCOUNTER — Other Ambulatory Visit: Payer: Self-pay | Admitting: *Deleted

## 2021-03-24 DIAGNOSIS — G5601 Carpal tunnel syndrome, right upper limb: Secondary | ICD-10-CM

## 2021-03-24 DIAGNOSIS — M542 Cervicalgia: Secondary | ICD-10-CM

## 2021-03-30 IMAGING — MR MR CERVICAL SPINE W/O CM
5 series · 39 of 48 positions shown · non-contrast
Comparison: None.

CLINICAL DATA: Ascending paralysis of right arm

EXAM:
MRI CERVICAL SPINE WITHOUT CONTRAST
TECHNIQUE: Multiplanar, multisequence MR imaging of the cervical spine was
performed. No intravenous contrast was administered.

[Series 9: T2 · sagittal · 3.0mm · 0.72mm/px · 6 of 15 slices shown (1 of 2)]
[im 1/15]
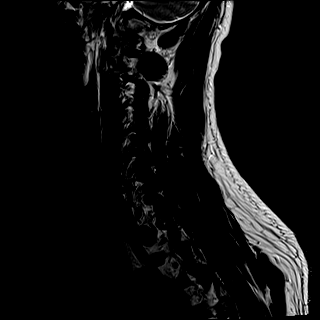
[im 3/15]
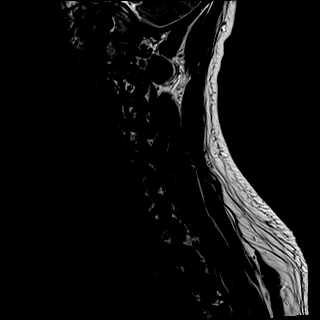
[im 6/15]
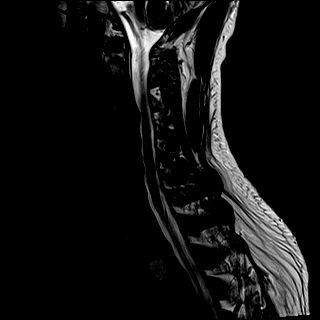
[im 9/15]
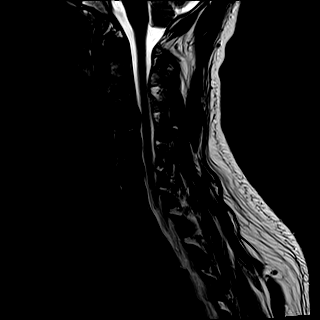
[im 12/15]
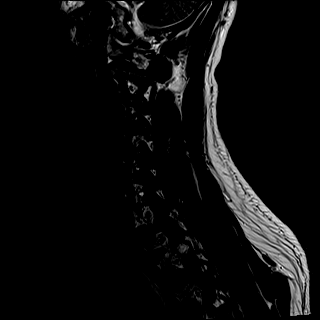
[im 15/15]
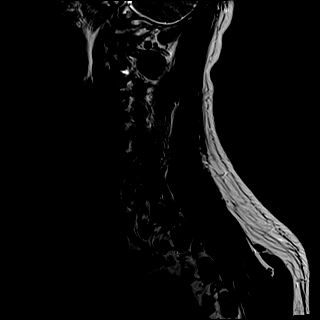

[Series 10: T1 · sagittal · 3.0mm · 0.69mm/px · 5 of 15 slices shown]
[im 1/15]
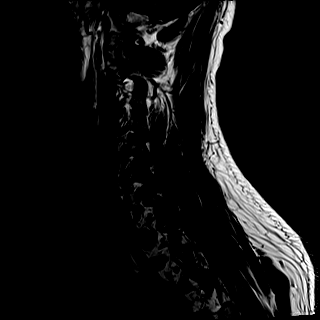
[im 4/15]
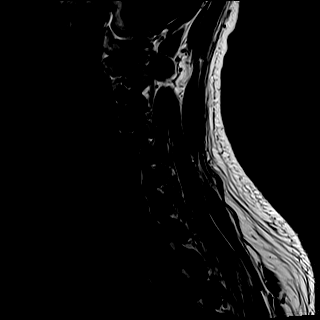
[im 8/15]
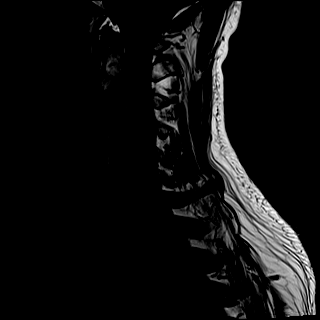
[im 11/15]
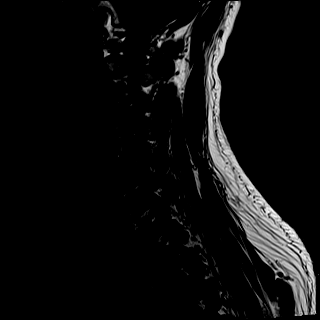
[im 15/15]
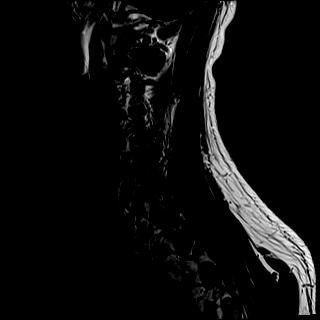

[Series 11: STIR · sagittal · 3.0mm · 0.86mm/px · 5 of 15 slices shown]
[im 1/15]
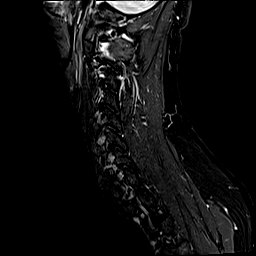
[im 4/15]
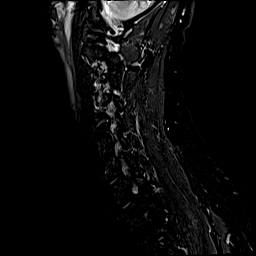
[im 8/15]
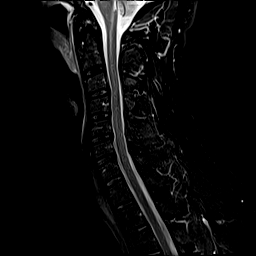
[im 11/15]
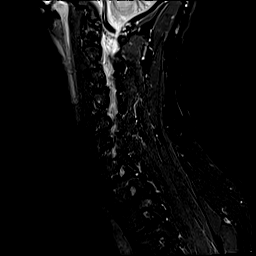
[im 15/15]
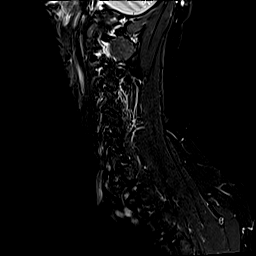

[Series 12: T2 · axial · 3.3mm · 0.66mm/px · z∈[-156,-2]mm · 15 of 45 slices shown (2 of 2)]
[im 1/45]
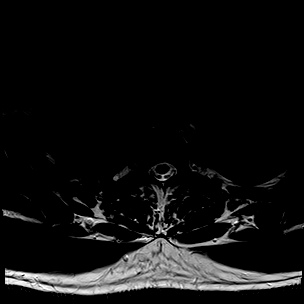
[im 3/45]
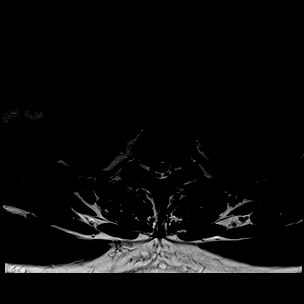
[im 6/45]
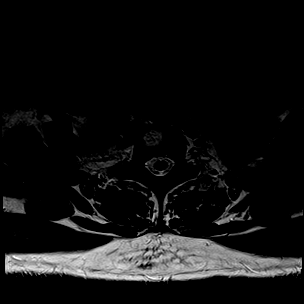
[im 9/45]
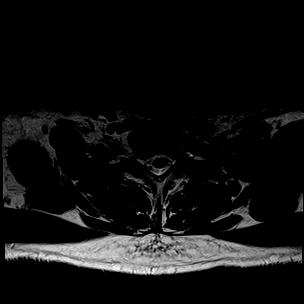
[im 12/45]
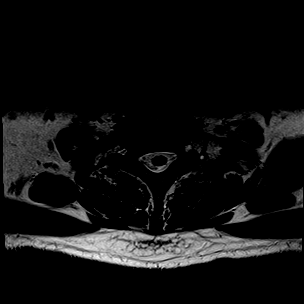
[im 15/45]
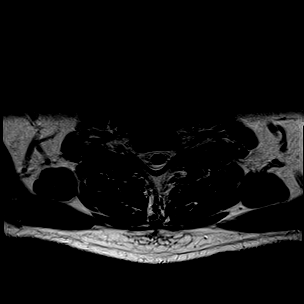
[im 18/45]
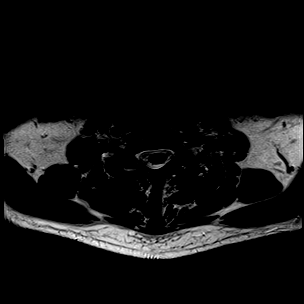
[im 21/45]
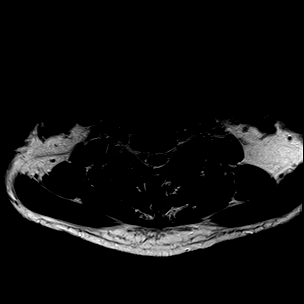
[im 24/45]
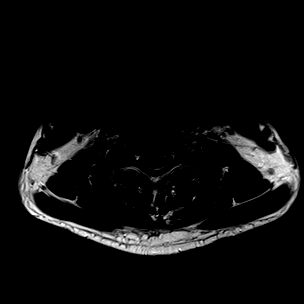
[im 27/45]
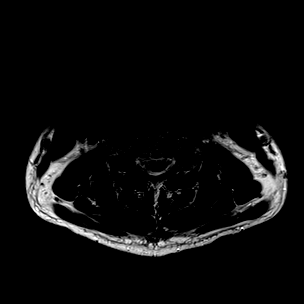
[im 30/45]
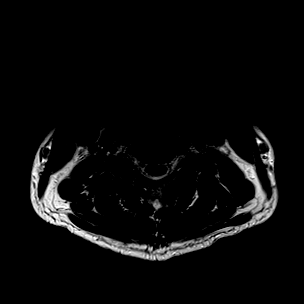
[im 33/45]
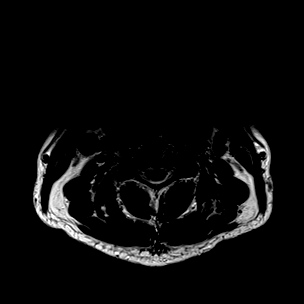
[im 36/45]
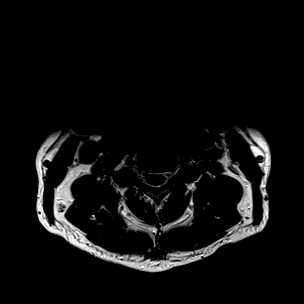
[im 39/45]
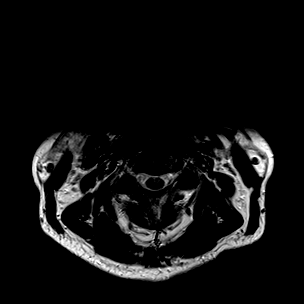
[im 45/45]
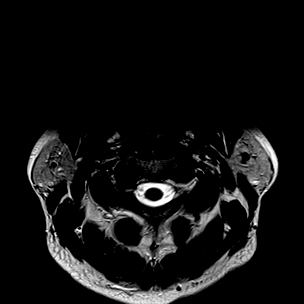

[Series 13: GRE · axial · 3.5mm · 0.39mm/px · z∈[-153,+2]mm · 8 of 45 slices shown]
[im 3/45]
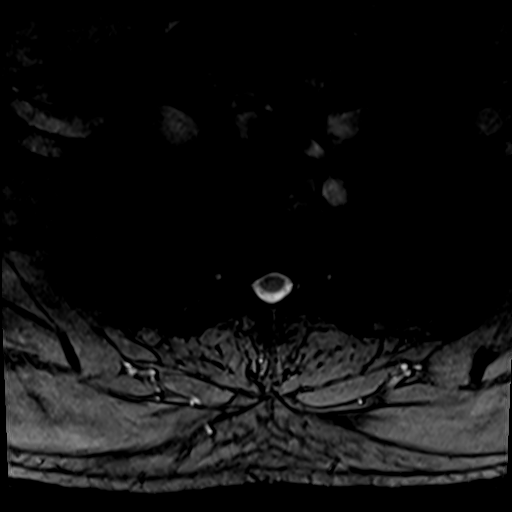
[im 9/45]
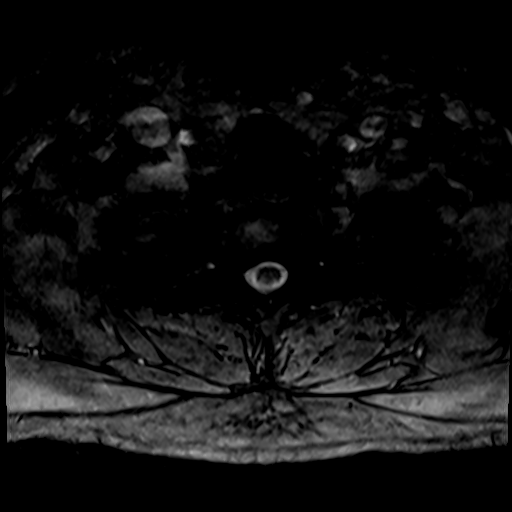
[im 15/45]
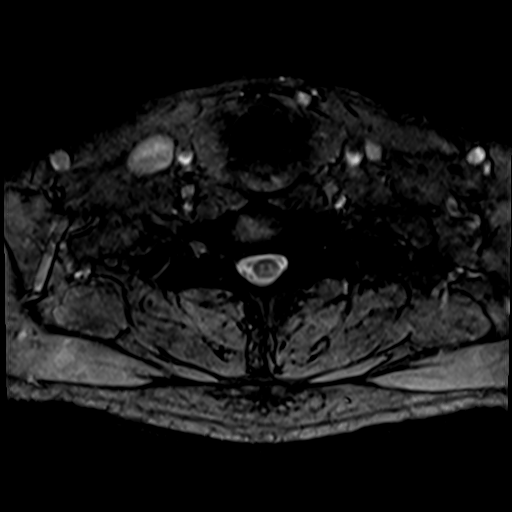
[im 21/45]
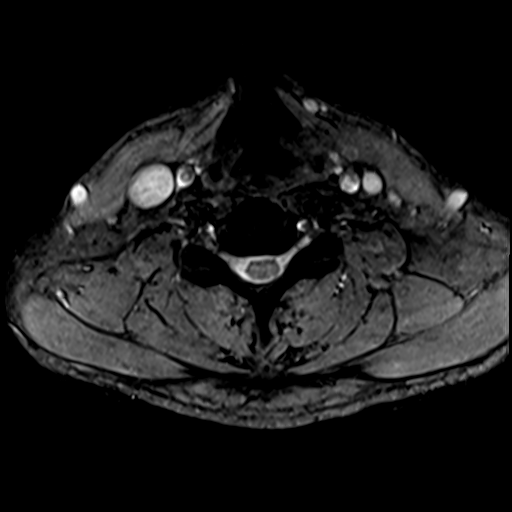
[im 27/45]
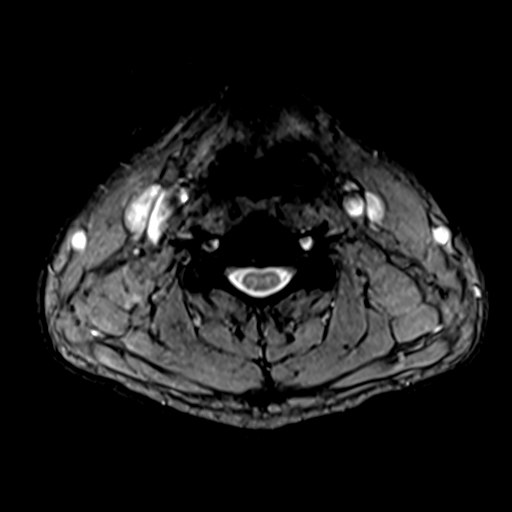
[im 33/45]
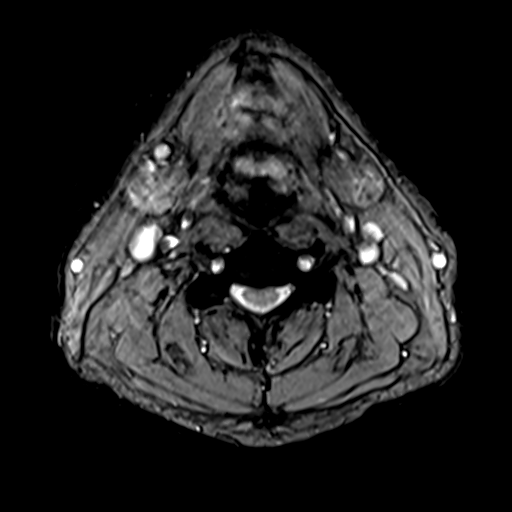
[im 39/45]
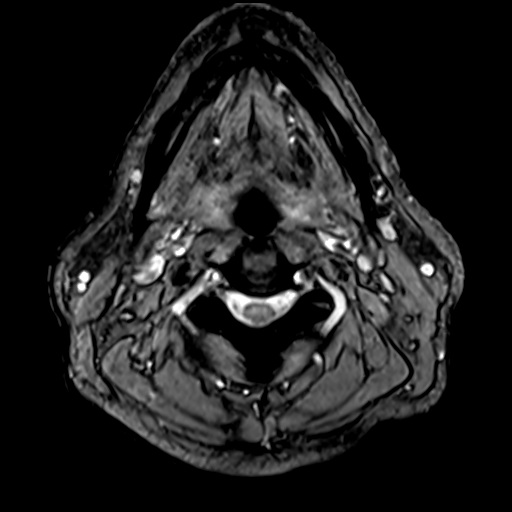
[im 45/45]
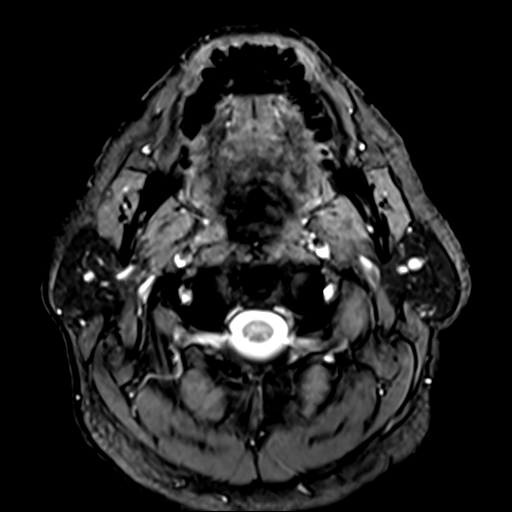

[39 of 48 positions shown; findings below may reference images not displayed]

FINDINGS: Alignment: Upper cervical reversal of lordosis.

Vertebrae: No fracture, evidence of discitis, or aggressive bone
lesion. Incidental hemangiomas noted at T2.

Cord: Normal signal and morphology.

Posterior Fossa, vertebral arteries, paraspinal tissues: Negative.
No evidence of inflammation or mass.

Disc levels:

C2-3: Unremarkable.

C3-4: Minor bulging or central protrusion. No facet spurring or
impingement

C4-5: Unremarkable.

C5-6: Mild disc narrowing and ridging. Mild disc bulging. No
impingement

C6-7: Unremarkable.

C7-T1:Unremarkable.
IMPRESSION: 1. No impingement or other explanation for right arm symptoms.
2. Early disc degeneration at C3-4 and C5-6.

## 2021-04-20 ENCOUNTER — Telehealth: Payer: Self-pay | Admitting: Family Medicine

## 2021-04-20 DIAGNOSIS — S60012A Contusion of left thumb without damage to nail, initial encounter: Secondary | ICD-10-CM | POA: Diagnosis not present

## 2021-04-20 DIAGNOSIS — S61032A Puncture wound without foreign body of left thumb without damage to nail, initial encounter: Secondary | ICD-10-CM | POA: Diagnosis not present

## 2021-04-20 NOTE — Telephone Encounter (Signed)
Patient reports that he had a 1/8 inch drill bit got into his finger.

## 2021-04-20 NOTE — Telephone Encounter (Signed)
I called the patient and he reports that he was doing some work around the house and he went to [facility] to get checked out. He reports that he was given something to immobilize the thumb in and I called the Urgent Care to see if x-rays were completed. They have been completed but not read by radiology.   Patient was told he could take Ibuprofen but he states that he was told previously not to take that or goody powders. The x-rays have been completed but have not been reviewed.   The patient wants to know if you need to see him for a follow up and he wants something for pain since he reports his pain is excruciating. Please advise.

## 2021-04-21 NOTE — Telephone Encounter (Signed)
Needs to be seen to insure no infection causing pain

## 2021-04-21 NOTE — Telephone Encounter (Signed)
Can you pls f/u with this pt for appt with [facility], no answer when I called

## 2021-04-22 NOTE — Telephone Encounter (Signed)
I called and spoke with pt to make a f/u appt, pt stated that he feels he is doing just fine, and is barely taking any pain meds and doesn't feel the need to be seen.

## 2021-04-27 ENCOUNTER — Encounter: Payer: Self-pay | Admitting: Diagnostic Neuroimaging

## 2021-04-27 ENCOUNTER — Ambulatory Visit: Payer: Medicaid Other | Admitting: Diagnostic Neuroimaging

## 2021-04-27 VITALS — BP 142/89 | HR 88 | Ht 74.0 in | Wt 212.2 lb

## 2021-04-27 DIAGNOSIS — R200 Anesthesia of skin: Secondary | ICD-10-CM | POA: Diagnosis not present

## 2021-04-27 NOTE — Progress Notes (Signed)
[facility]  PATIENT: Thomas Gutierrez DOB: 1966/08/13  REFERRING CLINICIAN: Susy Frizzle, MD HISTORY FROM: patient  REASON FOR VISIT: new consult    HISTORICAL  CHIEF COMPLAINT:  Chief Complaint  Patient presents with  . Neck pain, CTS on Right    Rm 6, "wake up every day with numb arms, right thumb stays numb; hard to turn my head- neck gets a catch in it"    HISTORY OF PRESENT ILLNESS:   UPDATE (04/27/21, [facility]): Since last visit, more bilateral hand numbness. Symptoms are increasing over past 6 months. No alleviating or aggravating factors. Tolerating meds; migraine are better.  PRIOR HPI (5312): 55 year old male with dizziness and headaches.  04/11/2020 patient presented to emergency room for dizziness and near syncope.  He was working underneath a car when he stood up and subsequently got dizzy and fell down.  No loss of consciousness.  Since that time anytime he moves his head he feels spinning sensation and nausea.  He is having occipital headaches.  Has some tenderness that is longstanding.  Sometimes he sees flashing lights, sensitive to light and lightheadedness.  He was prescribed meclizine without relief.  Patient also has been to the hospital from 03/05/2020 until 03/09/2020 for evaluation of right hand numbness and palpitations.  He was admitted for stroke work-up.  MRI of the brain and cervical spine, CTA of the head and neck, CT of the head and cervical spine all were unremarkable.  He was diagnosed with probable peripheral neuropathy and recommended to have outpatient follow-up.  Patient also has chronic insomnia, sleeps 4 to 5 hours at night.  Of note he does tend to sleep better when he is camping outside sleeping in a hammock.  He has history of deviated septum which interrupts his breathing and sleeping at night.  No prior history of migraines.  No family history of migraine.  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  He typically used a respirator but did not always have full body and eye protection.    REVIEW OF SYSTEMS: Full 14 system review of systems performed and negative with exception of: As per HPI.  ALLERGIES: Allergies  Allergen Reactions  . Haldol [Haloperidol Lactate] Other (See Comments)    REACTION: Muscle tightness (tension) all over body for 6 hours.     HOME MEDICATIONS: Outpatient Medications Prior to Visit  Medication Sig Dispense Refill  . ibuprofen (ADVIL) 800 MG tablet Take 800 mg by mouth 3 (three) times daily as needed.    . rizatriptan (MAXALT) 10 MG tablet Take 10 mg by mouth as needed for migraine. May repeat in 2 hours if needed    . sulfamethoxazole-trimethoprim (BACTRIM) 400-80 MG tablet Take 1 tablet by mouth 2 (two) times daily.    Marland Kitchen tiZANidine (ZANAFLEX) 4 MG tablet Take 1 tablet (4 mg total) by mouth every 6 (six) hours as needed for muscle spasms. 30 tablet 0  . Ibuprofen-Acetaminophen (ADVIL DUAL ACTION PO) Take by mouth.    . predniSONE (DELTASONE) 20 MG tablet 3 tabs poqday 1-2, 2 tabs poqday 3-4, 1 tab poqday 5-6 12 tablet 0  . meloxicam (MOBIC) 15 MG tablet Take 15 mg by mouth daily. (Patient not taking: Reported on 04/27/2021)     No facility-administered medications prior to visit.    PAST MEDICAL HISTORY: Past Medical History:  Diagnosis Date  . Vertigo     PAST SURGICAL HISTORY: Past  Surgical History:  Procedure Laterality Date  . NASAL SEPTOPLASTY W/ TURBINOPLASTY Bilateral 07/21/2020   Procedure: NASAL SEPTOPLASTY WITH BILATERAL  TURBINATE REDUCTION;  Surgeon: Leta Baptist, MD;  Location: [facility];  Service: ENT;  Laterality: Bilateral;    FAMILY HISTORY: Family History  Problem Relation Age of Onset  . Cancer Mother        breast  . Cancer Father 60       lung  . Cancer Sister        breast    SOCIAL HISTORY: Social History   Socioeconomic History  . Marital status: Single    Spouse  name: Not on file  . Number of children: 5  . Years of education: Not on file  . Highest education level: High school graduate  Occupational History    Comment: NA  Tobacco Use  . Smoking status: Never Smoker  . Smokeless tobacco: Current User    Types: Snuff  Vaping Use  . Vaping Use: Never used  Substance and Sexual Activity  . Alcohol use: No  . Drug use: No  . Sexual activity: Yes  Other Topics Concern  . Not on file  Social History Narrative   Lives with sig other x 27 years   Caffeine- sweet tea all day   I hike the [employer], play basketball   Social Determinants of Health   Financial Resource Strain: Not on file  Food Insecurity: Not on file  Transportation Needs: Not on file  Physical Activity: Not on file  Stress: Not on file  Social Connections: Not on file  Intimate Partner Violence: Not on file     PHYSICAL EXAM  GENERAL EXAM/CONSTITUTIONAL: Vitals:  Vitals:   04/27/21 1539  BP: (!) 142/89  Pulse: 88  Weight: 212 lb 3.2 oz (96.3 kg)  Height: 6\' 2"  (1.88 m)   Body mass index is 27.24 kg/m. Wt Readings from Last 3 Encounters:  04/27/21 212 lb 3.2 oz (96.3 kg)  03/13/21 212 lb (96.2 kg)  01/07/21 218 lb 6.4 oz (99.1 kg)    Patient is in no distress; well developed, nourished and groomed; neck is supple  CARDIOVASCULAR:  Examination of carotid arteries is normal; no carotid bruits  Regular rate and rhythm, no murmurs  Examination of peripheral vascular system by observation and palpation is normal  EYES:  Ophthalmoscopic exam of optic discs and posterior segments is normal; no papilledema or hemorrhages No exam data present  MUSCULOSKELETAL:  Gait, strength, tone, movements noted in Neurologic exam below  NEUROLOGIC: MENTAL STATUS:  No flowsheet data found.  awake, alert, oriented to person, place and time  recent and remote memory intact  normal attention and concentration  language fluent, comprehension intact,  naming intact  fund of knowledge appropriate  CRANIAL NERVE:   2nd - no papilledema on fundoscopic exam  2nd, 3rd, 4th, 6th - pupils equal and reactive to light, visual fields full to confrontation, extraocular muscles intact, no nystagmus  5th - facial sensation symmetric  7th - facial strength symmetric  8th - hearing intact  9th - palate elevates symmetrically, uvula midline  11th - shoulder shrug symmetric  12th - tongue protrusion midline  MOTOR:   normal bulk and tone, full strength in the BUE, BLE  SENSORY:   normal and symmetric to light touch, temperature, vibration; EXCEPT DECR PP IN RIGHT HAND (DIGITS 1-4) AND LEFT HAND DIGIT 1  COORDINATION:   finger-nose-finger, fine finger movements normal  REFLEXES:  deep tendon reflexes present and symmetric  GAIT/STATION:   narrow based gait     DIAGNOSTIC DATA (LABS, IMAGING, TESTING) - I reviewed patient records, labs, notes, testing and imaging myself where available.  Lab Results  Component Value Date   WBC 9.9 04/11/2020   HGB 16.1 04/11/2020   HCT 45.5 04/11/2020   MCV 88.2 04/11/2020   PLT 191 04/11/2020      Component Value Date/Time   NA 142 04/14/2020 1106   K 3.9 04/14/2020 1106   CL 102 04/14/2020 1106   CO2 31 04/14/2020 1106   GLUCOSE 107 (H) 04/14/2020 1106   BUN 19 04/14/2020 1106   CREATININE 0.99 04/14/2020 1106   CALCIUM 10.1 04/14/2020 1106   PROT 7.2 04/14/2020 1106   ALBUMIN 4.2 03/05/2020 1152   AST 14 04/14/2020 1106   ALT 25 04/14/2020 1106   ALKPHOS 79 03/05/2020 1152   BILITOT 0.6 04/14/2020 1106   GFRNONAA 87 04/14/2020 1106   GFRAA 100 04/14/2020 1106   Lab Results  Component Value Date   CHOL 170 11/18/2020   HDL 36 (L) 11/18/2020   LDLCALC 107 (H) 11/18/2020   TRIG 157 (H) 11/18/2020   CHOLHDL 4.7 11/18/2020   Lab Results  Component Value Date   HGBA1C 5.0 04/14/2020   No results found for: VITAMINB12 No results found for: TSH  03/05/20 CTA head /  neck No acute intracranial hemorrhage or new loss of gray-white differentiation to suggest evolving infarction. No large vessel occlusion, hemodynamically significant stenosis, or evidence of dissection.  03/06/20 MRI brain [I reviewed images myself and agree with interpretation. -[facility]]  Negative brain MRI.  No explanation for symptoms.  03/06/20 MRI cervical spine 1. No impingement or other explanation for right arm symptoms. 2. 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   ASSESSMENT AND PLAN  55 y.o. year old male here with:   Dx:  1. Hand numbness      PLAN:  BILATERAL (right > left) hand numbness - trial of wrist splints - check EMG/NCS (CTS eval)  positional vertigo (April 2021) - vestibular migraine vs BPPV  MIGRAINE PREVENTION  -Stop or avoid smoking -Decrease or avoid caffeine / alcohol -Eat and sleep on a regular schedule -Exercise several times per week -start amitriptyline 25mg  at bedtime  MIGRAINE RESCUE  - ibuprofen, tylenol as needed - rizatriptan (Maxalt) 10mg  as needed for breakthrough headache; may repeat x 1 after 2 hours; max 2 tabs per day or 8 per month  Orders Placed This Encounter  Procedures  . NCV with EMG(electromyography)   Return for for NCV/EMG.    Penni Bombard, MD 6/0/1093, 2:35 PM Certified in Neurology, Neurophysiology and Neuroimaging  [facility] [address] [place], [place] [address] [phone]

## 2021-04-27 NOTE — Patient Instructions (Signed)
  BILATERAL (right > left) hand numbness - trial of wrist splints - check EMG/NCS (CTS eval)

## 2021-05-01 ENCOUNTER — Telehealth: Payer: Self-pay | Admitting: *Deleted

## 2021-05-01 NOTE — Telephone Encounter (Signed)
Received call from patient.   Reports that he has been using Zanaflex to help him sleep and has noted that it relaxes him enough to sleep.   States that he would like to try sleep aid.   Please advise?

## 2021-05-04 NOTE — Telephone Encounter (Signed)
Call placed to patient and patient made aware.   States that he would prefer to not take Trazodone sue to class of medication.   Advised he can use OTC Melatonin.

## 2021-05-04 NOTE — Telephone Encounter (Signed)
Most sleep meds are habit forming.  I would try trazodone 50 mg poqhs for sleep.  Not habit forming.

## 2021-05-07 ENCOUNTER — Telehealth: Payer: Self-pay | Admitting: Diagnostic Neuroimaging

## 2021-05-07 ENCOUNTER — Encounter: Payer: Medicaid Other | Admitting: Diagnostic Neuroimaging

## 2021-05-07 NOTE — Telephone Encounter (Signed)
Pt called, my daughter has Covid and I do not know if I am positive yet. Need to reschedule my Nerve conduction.  Reschedule appt for  6/9.

## 2021-05-26 DIAGNOSIS — H5213 Myopia, bilateral: Secondary | ICD-10-CM | POA: Diagnosis not present

## 2021-05-27 DIAGNOSIS — T2000XA Burn of unspecified degree of head, face, and neck, unspecified site, initial encounter: Secondary | ICD-10-CM | POA: Diagnosis not present

## 2021-05-28 ENCOUNTER — Ambulatory Visit: Payer: Medicaid Other | Admitting: Nurse Practitioner

## 2021-05-28 ENCOUNTER — Ambulatory Visit (INDEPENDENT_AMBULATORY_CARE_PROVIDER_SITE_OTHER): Payer: Medicaid Other | Admitting: Diagnostic Neuroimaging

## 2021-05-28 ENCOUNTER — Encounter: Payer: Self-pay | Admitting: Nurse Practitioner

## 2021-05-28 ENCOUNTER — Other Ambulatory Visit: Payer: Self-pay

## 2021-05-28 ENCOUNTER — Encounter: Payer: Medicaid Other | Admitting: Diagnostic Neuroimaging

## 2021-05-28 VITALS — BP 128/84 | HR 88 | Temp 98.3°F | Ht 74.4 in | Wt 214.6 lb

## 2021-05-28 DIAGNOSIS — Z0289 Encounter for other administrative examinations: Secondary | ICD-10-CM

## 2021-05-28 DIAGNOSIS — G5601 Carpal tunnel syndrome, right upper limb: Secondary | ICD-10-CM

## 2021-05-28 DIAGNOSIS — L551 Sunburn of second degree: Secondary | ICD-10-CM | POA: Diagnosis not present

## 2021-05-28 DIAGNOSIS — R200 Anesthesia of skin: Secondary | ICD-10-CM

## 2021-05-28 NOTE — Procedures (Signed)
   [facility]  NCS (NERVE CONDUCTION STUDY) WITH EMG (ELECTROMYOGRAPHY) REPORT   STUDY DATE: 05/28/21 PATIENT NAME: Thomas Gutierrez DOB: 1966/06/29 MRN: [id number]  ORDERING CLINICIAN: Andrey Spearman, MD   TECHNOLOGIST: Sherre Scarlet ELECTROMYOGRAPHER: Earlean Polka. Atha Muradyan, MD  CLINICAL INFORMATION: 55 year old male with right > left hand numbness.  FINDINGS: NERVE CONDUCTION STUDY:  Right median motor response has prolonged latency, decreased amplitude, normal conduction velocity.  Left median and right ulnar motor responses are normal.  Right median sensory response has prolonged peak latency and decreased amplitude.  Left median and right ulnar sensory responses are normal.  Right ulnar F-wave latency is normal.    NEEDLE ELECTROMYOGRAPHY:  Needle examination of right upper extremity is normal.   IMPRESSION:   Abnormal study demonstrating: -Right median neuropathy at the wrist consistent with mild-moderate right carpal tunnel syndrome.    INTERPRETING PHYSICIAN:  Penni Bombard, MD Certified in Neurology, Neurophysiology and Neuroimaging  [facility] [address], [place], [place] [address] [phone]   [facility]    Nerve / Sites Muscle Latency Ref. Amplitude Ref. Rel Amp Segments Distance Velocity Ref. Area    ms ms mV mV %  cm m/s m/s mVms  R Median - APB     Wrist APB 4.9 ?4.4 3.6 ?4.0 100 Wrist - APB 7   11.6     Upper arm APB 9.5  3.3  91.1 Upper arm - Wrist 24 53 ?49 10.6  L Median - APB     Wrist APB 4.0 ?4.4 4.1 ?4.0 100 Wrist - APB 7   15.2     Upper arm APB 8.4  3.8  92.8 Upper arm - Wrist 23 53 ?49 13.8  R Ulnar - ADM     Wrist ADM 2.6 ?3.3 8.7 ?6.0 100 Wrist - ADM 7   25.4     B.Elbow ADM 6.1  8.1  92.7 B.Elbow - Wrist 22 62 ?49 23.7     A.Elbow ADM 7.7  7.7  94.8 A.Elbow - B.Elbow 10 62 ?49 23.6           SNC    Nerve / Sites Rec. Site Peak Lat Ref.  Amp Ref. Segments Distance    ms ms V V   cm  R Median - Orthodromic (Dig II, Mid palm)     Dig II Wrist 4.0 ?3.4 8 ?10 Dig II - Wrist 13  L Median - Orthodromic (Dig II, Mid palm)     Dig II Wrist 3.2 ?3.4 16 ?10 Dig II - Wrist 13  R Ulnar - Orthodromic, (Dig V, Mid palm)     Dig V Wrist 2.8 ?3.1 10 ?5 Dig V - Wrist 56           F  Wave    Nerve F Lat Ref.   ms ms  R Ulnar - ADM 29.3 ?32.0       EMG Summary Table    Spontaneous MUAP Recruitment  Muscle IA Fib PSW Fasc Other Amp Dur. Poly Pattern  R. Deltoid Normal None None None _______ Normal Normal Normal Normal  R. Biceps brachii Normal None None None _______ Normal Normal Normal Normal  R. Triceps brachii Normal None None None _______ Normal Normal Normal Normal  R. Flexor carpi radialis Normal None None None _______ Normal Normal Normal Normal  R. First dorsal interosseous Normal None None None _______ Normal Normal Normal Normal

## 2021-05-28 NOTE — Progress Notes (Signed)
Orders Placed This Encounter  Procedures   Ambulatory referral to [place], MD 04/27/9356, 0:17 PM Certified in Neurology, Neurophysiology and Neuroimaging  [facility] [address], [place], [place] [address] [phone]

## 2021-05-28 NOTE — Patient Instructions (Signed)
Sunburn, Adult  Sunburn is damage to the skin that is caused by being in the sun too much. Getting too much sun over and over can cause wrinkles and dark spots on the skin (sun spots). It can also increase your chance of getting skin cancer. Follow these instructions at home: Medicines Take or apply over-the-counter and prescription medicines only as told by your doctor. If you were prescribed an antibiotic medicine, use it as told by your doctor. Do not stop using the antibiotic even if your condition improves. General instructions Avoid being in the sun. Wear clothing that covers your sunburn. Do not put ice on your sunburn. Try taking a cool bath or putting a cool, wet cloth (cool compress) on your skin. This may help with pain. Drink enough fluid to keep your pee (urine) pale yellow. Try putting aloe vera or a moisturizer that has soy in it on your sunburn. This may help your pain. Do not do this if you have blisters. Do not break any blisters if you have them. Keep all follow-up visits as told by your doctor. This is important.   Preventing sunburn To keep from getting sunburned: Try to stay out of the sun between 10 a.m. and 4 p.m. The sun is strongest during that time. Put on sunscreen 30 minutes or more before you go out in the sun. Use a sunscreen with an SPF of 15 or higher. If you will be in the sun for a long time, think about using an SPF of 30 or higher. Use a sunscreen that protects against all of the sun's rays (broad-spectrum) and is water-resistant. Put sunscreen on again: About every 2 hours while you are in the sun. More often if you are sweating a lot while you are in the sun. After you get wet from swimming or playing in water. Wear long sleeves, a hat, and sunglasses when you are outside. Talk with your doctor about medicines, herbs, and foods that can make you more sensitive to light. Avoid these, if possible. Do not use tanning beds.   Contact a doctor if: You have  a fever or chills. Your symptoms do not get better with treatment. Medicine does not help your pain. Your burn gets more painful or swollen. You have open blisters on your skin. Get help right away if: You start to throw up (vomit). You start to have watery poop (diarrhea). You feel dizzy. You pass out. You have a very bad headache or you feel confused. You have very bad blisters. You have pus or fluid coming from the blisters. Summary Sunburn is damage to the skin that is caused by being in the sun too much. Do not put ice on your sunburn. Try taking a cool bath or putting a cool, wet cloth (cool compress) on your skin. This may help with pain. Do not break any blisters if you have them. Put on sunscreen 30 minutes or more before you go out in the sun. This can help you to not get sunburned. This information is not intended to replace advice given to you by your health care provider. Make sure you discuss any questions you have with your health care provider. Document Revised: 10/02/2020 Document Reviewed: 10/02/2020 Elsevier Patient Education  2021 [employer].

## 2021-05-28 NOTE — Progress Notes (Signed)
Subjective:    Patient ID: Thomas Gutierrez, male    DOB: [birth year], 55 y.o.   MRN: [id number]  HPI: Thomas Gutierrez is a 55 y.o. male presenting for  Chief Complaint  Patient presents with   Sunburn    Went to UC last night, given silvadene for burn. Had swelling on the forehead and redness, blister on the right eye. Pt had on dark sunglasses. Did not have on sun screen on face.no vision concerns   SUNBURN Reports sunburn started Sunday he was outside and forgot to wear sunscreen.  He was outside the pool.  Has a bulge in his vision.  Went to [employer] care last night.   Duration: days Location: forehead - around eyes there is flud build up History of trauma in area: has rhinoplasty 1 year ago Pain: no Quality: no painful Severity: not painful Double vision: no Blurred vision: no Redness: no Swelling: yes Oozing: no Pus: no Fevers: no Headache: no Tolerating fluids: Nausea/vomiting: no Status: stable Treatments attempted: slivadene   Allergies  Allergen Reactions   Haldol [Haloperidol Lactate] Other (See Comments)    REACTION: Muscle tightness (tension) all over body for 6 hours.     Outpatient Encounter Medications as of 05/28/2021  Medication Sig   ibuprofen (ADVIL) 800 MG tablet Take 800 mg by mouth 3 (three) times daily as needed.   [DISCONTINUED] meloxicam (MOBIC) 15 MG tablet Take 15 mg by mouth daily. (Patient not taking: Reported on 04/27/2021)   [DISCONTINUED] rizatriptan (MAXALT) 10 MG tablet Take 10 mg by mouth as needed for migraine. May repeat in 2 hours if needed   [DISCONTINUED] sulfamethoxazole-trimethoprim (BACTRIM) 400-80 MG tablet Take 1 tablet by mouth 2 (two) times daily.   [DISCONTINUED] tiZANidine (ZANAFLEX) 4 MG tablet Take 1 tablet (4 mg total) by mouth every 6 (six) hours as needed for muscle spasms. (Patient not taking: Reported on 05/28/2021)   No facility-administered encounter medications on file as of 05/28/2021.    Patient Active Problem List    Diagnosis Date Noted   Cerebral thrombosis with cerebral infarction 03/06/2020   Cerebral embolism with cerebral infarction 03/06/2020   Subarachnoid hemorrhage 03/06/2020   Intracerebral hemorrhage 03/06/2020   Right upper extremity numbness 03/05/2020    Past Medical History:  Diagnosis Date   Vertigo     Relevant past medical, surgical, family and social history reviewed and updated as indicated. Interim medical history since our last visit reviewed.  Review of Systems Per HPI unless specifically indicated above     Objective:    BP 128/84   Pulse 88   Temp 98.3 F (36.8 C)   Ht 6' 2.4" (1.89 m)   Wt 214 lb 9.6 oz (97.3 kg)   SpO2 98%   BMI 27.26 kg/m   Wt Readings from Last 3 Encounters:  05/28/21 214 lb 9.6 oz (97.3 kg)  04/27/21 212 lb 3.2 oz (96.3 kg)  03/13/21 212 lb (96.2 kg)    Physical Exam Vitals and nursing note reviewed.  Constitutional:      General: He is not in acute distress.    Appearance: Normal appearance. He is not toxic-appearing.  HENT:     Head: Normocephalic and atraumatic.      Comments: Fluid-filled bullae as pictured above.  Periorbital edema R > L eye Eyes:     General: No scleral icterus.       Right eye: No discharge.        Left eye: No discharge.  Extraocular Movements: Extraocular movements intact.     Comments: Fluid-filled bullae noted medial to right eye in between eye and nose  Skin:    Findings: Erythema present. No rash.     Comments: Erythema noted to forehead with plaques that represent healing blisters  Neurological:     Mental Status: He is alert and oriented to person, place, and time.     Motor: No weakness.  Psychiatric:        Mood and Affect: Mood normal.        Behavior: Behavior normal.        Thought Content: Thought content normal.        Judgment: Judgment normal.       Assessment & Plan:  1. Sunburn of second degree Acute.  Periorbital edema and bullae noted likely due to fluid accumulation  secondary to blistering from sunburn.  No s/s sun poisoning today.  Blistering and fluid will likely be self limiting.  Encouraged use of cold compress to help with swelling.  Can continue ibuprofen as previously prescribed.  With any s/s sun poisoning, headache, fever, or bullae obstructing vision or vision changes, go to ER.     Follow up plan: Return if symptoms worsen or fail to improve.

## 2021-06-01 ENCOUNTER — Telehealth: Payer: Self-pay

## 2021-06-01 NOTE — Telephone Encounter (Signed)
Referral to Hand Surgery sent to Dr. Fredna Dow at [facility]. I called them and they do accept [employer].  P: [phone]. F: [phone].

## 2021-06-15 DIAGNOSIS — G5601 Carpal tunnel syndrome, right upper limb: Secondary | ICD-10-CM | POA: Diagnosis not present

## 2021-06-15 DIAGNOSIS — R200 Anesthesia of skin: Secondary | ICD-10-CM | POA: Diagnosis not present

## 2021-07-07 ENCOUNTER — Ambulatory Visit: Payer: Medicaid Other | Admitting: Family Medicine

## 2021-07-07 ENCOUNTER — Encounter: Payer: Self-pay | Admitting: Family Medicine

## 2021-07-07 ENCOUNTER — Other Ambulatory Visit: Payer: Self-pay

## 2021-07-07 VITALS — BP 144/94 | HR 78 | Temp 98.2°F | Ht 74.4 in | Wt 215.0 lb

## 2021-07-07 DIAGNOSIS — M25512 Pain in left shoulder: Secondary | ICD-10-CM | POA: Diagnosis not present

## 2021-07-07 DIAGNOSIS — G8929 Other chronic pain: Secondary | ICD-10-CM

## 2021-07-07 DIAGNOSIS — M7702 Medial epicondylitis, left elbow: Secondary | ICD-10-CM

## 2021-07-07 DIAGNOSIS — M25511 Pain in right shoulder: Secondary | ICD-10-CM

## 2021-07-07 NOTE — Progress Notes (Signed)
Subjective:    Patient ID: Thomas Gutierrez, male    DOB: Nov 18, 1966, 55 y.o.   MRN: [id number]  HPI MRI c-spine 02/2020- FINDINGS: Alignment: Upper cervical reversal of lordosis.   Vertebrae: No fracture, evidence of discitis, or aggressive bone lesion. Incidental hemangiomas noted at T2.   Cord: Normal signal and morphology.   Posterior Fossa, vertebral arteries, paraspinal tissues: Negative. No evidence of inflammation or mass.   Disc levels:   C2-3: Unremarkable.   C3-4: Minor bulging or central protrusion. No facet spurring or impingement   C4-5: Unremarkable.   C5-6: Mild disc narrowing and ridging. Mild disc bulging. No impingement   C6-7: Unremarkable.   C7-T1:Unremarkable.   03/13/21 Recently, the patient was hiking.  He walked into a low-lying branch that forcibly hyperextended his neck.  Ever since that time he has had pain on the right side of his neck.  He has numbness and tingling radiating down his right arm.  However the numbness and tingling begins around the level of the elbow and radiates into his thumb, index finger, and middle finger.  He reports decreasing grip strength.  He is dropping objects due to lack of dexterity.  He has a positive Tinel's sign today.  He has a weakly positive Phalen sign.  However he has no weakness in his biceps or triceps and his biceps reflex and brachioradialis reflex are normal.  At that time, my plan was:  I believe the patient suffered a whiplash injury to his neck.  I will treat this with a prednisone taper pack due to inflammation and also Zanaflex as needed for muscle spasms.  I believe this should resolve gradually on its own.  However I believe the numbness in his right arm is more likely due to carpal tunnel syndrome and has nothing to do with a neck injury.  I recommended nerve conduction studies to evaluate once and for all to determine the cause of the paresthesias in his right arm as he is previously reported this in the  past and his MRI was unremarkable.  Therefore I will order the nerve conduction studies and likely refer to a hand specialist if carpal tunnel is found  07/07/21 Patient reports bilateral shoulder pain for several months.  He has been lifting weights.  He states that a few years ago he injured his right shoulder throwing a football with his children.  However over the last few months he has pain with abduction greater than 90 degrees in both shoulders.  He has pain with internal and external rotation.  He has a positive empty can sign today.  He also has a positive Hawking's maneuver in both shoulders today.  He also complains of pain over the medial epicondyle.  The pain is made worse by wrist flexion and gripping activities.  The pain in his elbow is keeping him awake at night.  It hurts simply to touch the bone.  He denies any falls or injuries. Past Medical History:  Diagnosis Date   Vertigo    Past Surgical History:  Procedure Laterality Date   NASAL SEPTOPLASTY W/ TURBINOPLASTY Bilateral 07/21/2020   Procedure: NASAL SEPTOPLASTY WITH BILATERAL  TURBINATE REDUCTION;  Surgeon: Leta Baptist, MD;  Location: [place];  Service: ENT;  Laterality: Bilateral;   Current Outpatient Medications on File Prior to Visit  Medication Sig Dispense Refill   ibuprofen (ADVIL) 800 MG tablet Take 800 mg by mouth 3 (three) times daily as needed.  No current facility-administered medications on file prior to visit.   Allergies  Allergen Reactions   Haldol [Haloperidol Lactate] Other (See Comments)    REACTION: Muscle tightness (tension) all over body for 6 hours.    Social History   Socioeconomic History   Marital status: Single    Spouse name: Not on file   Number of children: 5   Years of education: Not on file   Highest education level: High school graduate  Occupational History    Comment: NA  Tobacco Use   Smoking status: Never   Smokeless tobacco: Current    Types: Snuff   Vaping Use   Vaping Use: Never used  Substance and Sexual Activity   Alcohol use: No   Drug use: No   Sexual activity: Yes  Other Topics Concern   Not on file  Social History Narrative   Lives with sig other x 27 years   Caffeine- sweet tea all day   I hike the [employer], play basketball   Social Determinants of Health   Financial Resource Strain: Not on file  Food Insecurity: Not on file  Transportation Needs: Not on file  Physical Activity: Not on file  Stress: Not on file  Social Connections: Not on file  Intimate Partner Violence: Not on file      Review of Systems     Objective:   Physical Exam Vitals reviewed.  Constitutional:      Appearance: Normal appearance. He is normal weight.  Cardiovascular:     Rate and Rhythm: Normal rate and regular rhythm.     Heart sounds: Normal heart sounds. No murmur heard.   No friction rub. No gallop.  Pulmonary:     Effort: Pulmonary effort is normal. No respiratory distress.     Breath sounds: Normal breath sounds. No stridor. No wheezing or rales.  Musculoskeletal:     Right shoulder: No tenderness, bony tenderness or crepitus. Decreased range of motion. Decreased strength.     Left shoulder: No tenderness, bony tenderness or crepitus. Decreased range of motion. Decreased strength.     Left elbow: Normal range of motion. Tenderness present in medial epicondyle.  Neurological:     Mental Status: He is alert.         Assessment & Plan:  Medial epicondylitis of left elbow  Chronic pain of both shoulders I believe the patient has medial epicondylitis.  Using sterile technique, I injected around the insertion of the tendon on the medial epicondyle and the medial forearm using 1 cc of 0.1% lidocaine and 1 cc of 40 mg/mL Kenalog.  The patient tolerated the procedure well without complication.  I also believe that he likely has bilateral supraspinatus tendinitis versus bursitis in his shoulders.  If he does well  with the cortisone shot in his elbow he can return at any time and we can inject his shoulders.  Reassess in 1 week

## 2021-07-08 ENCOUNTER — Telehealth: Payer: Self-pay | Admitting: Family Medicine

## 2021-07-08 NOTE — Telephone Encounter (Signed)
Pt called in stating that he received a cortisone shot in his elbow recently from Dr. Dennard Schaumann. Pt stated that dr told him if the shot worked that he would be able to get another shot in his other shoulder. Please advise  Cb#: [phone]

## 2021-07-08 NOTE — Telephone Encounter (Signed)
Patient received injection on 07/07/2021.  Per chart notes, PCP recommended that patient can return at any time for B shoulder injections.   Please contact patient to schedule appointment.

## 2021-07-16 DIAGNOSIS — L255 Unspecified contact dermatitis due to plants, except food: Secondary | ICD-10-CM | POA: Diagnosis not present

## 2021-07-16 DIAGNOSIS — S80822A Blister (nonthermal), left lower leg, initial encounter: Secondary | ICD-10-CM | POA: Diagnosis not present

## 2021-07-16 DIAGNOSIS — S80821A Blister (nonthermal), right lower leg, initial encounter: Secondary | ICD-10-CM | POA: Diagnosis not present

## 2021-07-16 DIAGNOSIS — L282 Other prurigo: Secondary | ICD-10-CM | POA: Diagnosis not present

## 2021-08-10 DIAGNOSIS — G5601 Carpal tunnel syndrome, right upper limb: Secondary | ICD-10-CM | POA: Diagnosis not present

## 2021-08-11 ENCOUNTER — Ambulatory Visit: Payer: Medicaid Other | Admitting: Family Medicine

## 2021-08-11 ENCOUNTER — Other Ambulatory Visit: Payer: Self-pay

## 2021-08-11 VITALS — BP 120/86 | HR 77 | Temp 97.2°F | Ht 74.4 in | Wt 214.0 lb

## 2021-08-11 DIAGNOSIS — M7581 Other shoulder lesions, right shoulder: Secondary | ICD-10-CM

## 2021-08-11 NOTE — Progress Notes (Signed)
Subjective:    Patient ID: Thomas Gutierrez, male    DOB: 08-21-1966, 55 y.o.   MRN: [id number]  HPI MRI c-spine 02/2020- FINDINGS: Alignment: Upper cervical reversal of lordosis.   Vertebrae: No fracture, evidence of discitis, or aggressive bone lesion. Incidental hemangiomas noted at T2.   Cord: Normal signal and morphology.   Posterior Fossa, vertebral arteries, paraspinal tissues: Negative. No evidence of inflammation or mass.   Disc levels:   C2-3: Unremarkable.   C3-4: Minor bulging or central protrusion. No facet spurring or impingement   C4-5: Unremarkable.   C5-6: Mild disc narrowing and ridging. Mild disc bulging. No impingement   C6-7: Unremarkable.   C7-T1:Unremarkable.   03/13/21 Recently, the patient was hiking.  He walked into a low-lying branch that forcibly hyperextended his neck.  Ever since that time he has had pain on the right side of his neck.  He has numbness and tingling radiating down his right arm.  However the numbness and tingling begins around the level of the elbow and radiates into his thumb, index finger, and middle finger.  He reports decreasing grip strength.  He is dropping objects due to lack of dexterity.  He has a positive Tinel's sign today.  He has a weakly positive Phalen sign.  However he has no weakness in his biceps or triceps and his biceps reflex and brachioradialis reflex are normal.  At that time, my plan was:  I believe the patient suffered a whiplash injury to his neck.  I will treat this with a prednisone taper pack due to inflammation and also Zanaflex as needed for muscle spasms.  I believe this should resolve gradually on its own.  However I believe the numbness in his right arm is more likely due to carpal tunnel syndrome and has nothing to do with a neck injury.  I recommended nerve conduction studies to evaluate once and for all to determine the cause of the paresthesias in his right arm as he is previously reported this in the  past and his MRI was unremarkable.  Therefore I will order the nerve conduction studies and likely refer to a hand specialist if carpal tunnel is found  07/07/21 Patient reports bilateral shoulder pain for several months.  He has been lifting weights.  He states that a few years ago he injured his right shoulder throwing a football with his children.  However over the last few months he has pain with abduction greater than 90 degrees in both shoulders.  He has pain with internal and external rotation.  He has a positive empty can sign today.  He also has a positive Hawking's maneuver in both shoulders today.  He also complains of pain over the medial epicondyle.  The pain is made worse by wrist flexion and gripping activities.  The pain in his elbow is keeping him awake at night.  It hurts simply to touch the bone.  He denies any falls or injuries.  At that time, my plan was:  I believe the patient has medial epicondylitis.  Using sterile technique, I injected around the insertion of the tendon on the medial epicondyle and the medial forearm using 1 cc of 0.1% lidocaine and 1 cc of 40 mg/mL Kenalog.  The patient tolerated the procedure well without complication.  I also believe that he likely has bilateral supraspinatus tendinitis versus bursitis in his shoulders.  If he does well with the cortisone shot in his elbow he can return at any time  and we can inject his shoulders.  Reassess in 1 week  08/11/21 Patient is here today requesting a cortisone injection in his right shoulder.  He states that it aches and throbs at night when he is trying to sleep in the subacromial space.  He also has pain with lifting above 90 degrees but specifically bench press or military press.  He has a positive empty can sign.  He has a positive Hawking sign.  Although his strength is quite good Past Medical History:  Diagnosis Date  . Vertigo    Past Surgical History:  Procedure Laterality Date  . NASAL SEPTOPLASTY W/  TURBINOPLASTY Bilateral 07/21/2020   Procedure: NASAL SEPTOPLASTY WITH BILATERAL  TURBINATE REDUCTION;  Surgeon: Leta Baptist, MD;  Location: [place];  Service: ENT;  Laterality: Bilateral;   Current Outpatient Medications on File Prior to Visit  Medication Sig Dispense Refill  . ibuprofen (ADVIL) 800 MG tablet Take 800 mg by mouth 3 (three) times daily as needed. (Patient not taking: Reported on 07/07/2021)     No current facility-administered medications on file prior to visit.   Allergies  Allergen Reactions  . Haldol [Haloperidol Lactate] Other (See Comments)    REACTION: Muscle tightness (tension) all over body for 6 hours.    Social History   Socioeconomic History  . Marital status: Single    Spouse name: Not on file  . Number of children: 5  . Years of education: Not on file  . Highest education level: High school graduate  Occupational History    Comment: NA  Tobacco Use  . Smoking status: Never  . Smokeless tobacco: Current    Types: Snuff  Vaping Use  . Vaping Use: Never used  Substance and Sexual Activity  . Alcohol use: No  . Drug use: No  . Sexual activity: Yes  Other Topics Concern  . Not on file  Social History Narrative   Lives with sig other x 27 years   Caffeine- sweet tea all day   I hike the [employer], play basketball   Social Determinants of Health   Financial Resource Strain: Not on file  Food Insecurity: Not on file  Transportation Needs: Not on file  Physical Activity: Not on file  Stress: Not on file  Social Connections: Not on file  Intimate Partner Violence: Not on file      Review of Systems     Objective:   Physical Exam Vitals reviewed.  Constitutional:      Appearance: Normal appearance. He is normal weight.  Cardiovascular:     Rate and Rhythm: Normal rate and regular rhythm.     Heart sounds: Normal heart sounds. No murmur heard.   No friction rub. No gallop.  Pulmonary:     Effort: Pulmonary effort  is normal. No respiratory distress.     Breath sounds: Normal breath sounds. No stridor. No wheezing or rales.  Musculoskeletal:     Right shoulder: No tenderness, bony tenderness or crepitus. Decreased range of motion. Decreased strength.  Neurological:     Mental Status: He is alert.         Assessment & Plan:  Tendinitis of right rotator cuff Using sterile technique, I injected the right subacromial space with 2 cc lidocaine, 2 cc of Marcaine, and 2 cc of 40 mg/mL Kenalog.  The patient tolerated the procedure well without complication.

## 2021-08-28 ENCOUNTER — Other Ambulatory Visit: Payer: Self-pay

## 2021-08-28 ENCOUNTER — Encounter: Payer: Self-pay | Admitting: Family Medicine

## 2021-08-28 ENCOUNTER — Ambulatory Visit: Payer: Medicaid Other | Admitting: Family Medicine

## 2021-08-28 VITALS — BP 154/82 | HR 88 | Temp 98.1°F | Resp 14 | Ht 74.5 in | Wt 214.0 lb

## 2021-08-28 DIAGNOSIS — R222 Localized swelling, mass and lump, trunk: Secondary | ICD-10-CM | POA: Diagnosis not present

## 2021-08-28 NOTE — Progress Notes (Signed)
Subjective:    Patient ID: Thomas Gutierrez, male    DOB: 05/31/1966, 55 y.o.   MRN: [id number]  HPI Patient was shaving recently when he noticed a mass over his left clavicle.  In the left supraclavicular space there is a noticeable, marked asymmetry.  There is a soft tissue poorly circumscribed mass in the left supraclavicular space.  It is nontender.  Its not firm.  There are no distinct margins.  It does not feel like a lipoma.  There are not palpable lymph nodes.  I would estimate that is approximately 3 cm x 4 cm in diameter.  Patient is uncertain of how long its been there.  There is no lymphadenopathy in the neck or in the axilla.  He denies any fevers or chills or night sweats or fatigue Past Medical History:  Diagnosis Date  . Vertigo    Past Surgical History:  Procedure Laterality Date  . NASAL SEPTOPLASTY W/ TURBINOPLASTY Bilateral 07/21/2020   Procedure: NASAL SEPTOPLASTY WITH BILATERAL  TURBINATE REDUCTION;  Surgeon: Leta Baptist, MD;  Location: [place];  Service: ENT;  Laterality: Bilateral;   No current outpatient medications on file prior to visit.   No current facility-administered medications on file prior to visit.   Allergies  Allergen Reactions  . Haldol [Haloperidol Lactate] Other (See Comments)    REACTION: Muscle tightness (tension) all over body for 6 hours.    Social History   Socioeconomic History  . Marital status: Single    Spouse name: Not on file  . Number of children: 5  . Years of education: Not on file  . Highest education level: High school graduate  Occupational History    Comment: NA  Tobacco Use  . Smoking status: Never  . Smokeless tobacco: Current    Types: Snuff  Vaping Use  . Vaping Use: Never used  Substance and Sexual Activity  . Alcohol use: No  . Drug use: No  . Sexual activity: Yes  Other Topics Concern  . Not on file  Social History Narrative   Lives with sig other x 27 years   Caffeine- sweet tea all day   I  hike the [employer], play basketball   Social Determinants of Health   Financial Resource Strain: Not on file  Food Insecurity: Not on file  Transportation Needs: Not on file  Physical Activity: Not on file  Stress: Not on file  Social Connections: Not on file  Intimate Partner Violence: Not on file      Review of Systems  All other systems reviewed and are negative.     Objective:   Physical Exam Vitals reviewed.  Constitutional:      Appearance: Normal appearance. He is normal weight.  Neck:   Cardiovascular:     Rate and Rhythm: Normal rate and regular rhythm.     Heart sounds: Normal heart sounds. No murmur heard.   No friction rub. No gallop.  Pulmonary:     Effort: Pulmonary effort is normal. No respiratory distress.     Breath sounds: Normal breath sounds. No wheezing, rhonchi or rales.  Musculoskeletal:     Cervical back: Full passive range of motion without pain.     Right lower leg: No edema.     Left lower leg: No edema.  Neurological:     Mental Status: He is alert.          Assessment & Plan:  Supraclavicular mass - Plan: CT Soft Tissue  Neck W Contrast Begin by obtaining a CT scan of the neck to characterize whether this is a benign mass such as a lipoma or indicating something pathologic such as lymphoma.  I cannot tell based on my clinical exam what this mass is.  May also be a localized collection of fluid due to an obstruction in the thoracic duct.  It is very soft but poorly circumscribed.  Feels too deep to be a cyst.  It is also to poorly defined to be a cyst.

## 2021-09-01 ENCOUNTER — Telehealth: Payer: Self-pay | Admitting: *Deleted

## 2021-09-01 NOTE — Telephone Encounter (Signed)
Received call from patient.   Reports that he is scheduled for CT for Supraclavicular mass on 09/11/2021.  Reports that he and wife noticed small hard knot to R side of chest near nipple. States that area is about the size of a BB. Denies any discharge from nipple or skin above the hard knot.   Please advise.

## 2021-09-01 NOTE — Telephone Encounter (Signed)
Patient returned call and made aware.   Agreeable to plan.

## 2021-09-11 ENCOUNTER — Ambulatory Visit
Admission: RE | Admit: 2021-09-11 | Discharge: 2021-09-11 | Disposition: A | Discharge status: Home / Self Care | Payer: Medicaid Other | Source: Ambulatory Visit | Attending: Family Medicine | Admitting: Family Medicine

## 2021-09-11 DIAGNOSIS — R222 Localized swelling, mass and lump, trunk: Secondary | ICD-10-CM

## 2021-09-11 DIAGNOSIS — R221 Localized swelling, mass and lump, neck: Secondary | ICD-10-CM | POA: Diagnosis not present

## 2021-09-11 DIAGNOSIS — M47812 Spondylosis without myelopathy or radiculopathy, cervical region: Secondary | ICD-10-CM | POA: Diagnosis not present

## 2021-09-11 MED ORDER — IOPAMIDOL (ISOVUE-300) INJECTION 61%
75.0000 mL | Freq: Once | INTRAVENOUS | Status: AC | PRN
  Administered 2021-09-11: 75 mL via INTRAVENOUS

## 2021-09-14 ENCOUNTER — Telehealth: Payer: Self-pay | Admitting: *Deleted

## 2021-09-14 MED ORDER — PERMETHRIN 5 % EX CREA: 1.0000 "application " | TOPICAL_CREAM | Freq: Once | CUTANEOUS | 0 refills | Status: AC

## 2021-09-14 NOTE — Telephone Encounter (Signed)
Received call from patient.   Reports that he has rash on lower legs that he believes is chiggers. States that he has tried OTC meds with no relief.   Prescription for Elimite sent to pharmacy.

## 2021-11-20 ENCOUNTER — Other Ambulatory Visit: Payer: Self-pay

## 2021-11-20 ENCOUNTER — Ambulatory Visit: Payer: Medicaid Other | Admitting: Family Medicine

## 2021-11-20 ENCOUNTER — Encounter: Payer: Self-pay | Admitting: Family Medicine

## 2021-11-20 VITALS — BP 138/72 | HR 97 | Resp 18 | Ht 74.5 in | Wt 214.0 lb

## 2021-11-20 DIAGNOSIS — M7702 Medial epicondylitis, left elbow: Secondary | ICD-10-CM | POA: Diagnosis not present

## 2021-11-20 DIAGNOSIS — Z125 Encounter for screening for malignant neoplasm of prostate: Secondary | ICD-10-CM

## 2021-11-20 DIAGNOSIS — Z0000 Encounter for general adult medical examination without abnormal findings: Secondary | ICD-10-CM

## 2021-11-20 DIAGNOSIS — Z1211 Encounter for screening for malignant neoplasm of colon: Secondary | ICD-10-CM | POA: Diagnosis not present

## 2021-11-20 NOTE — Progress Notes (Signed)
Subjective:    Patient ID: Thomas Gutierrez, male    DOB: March 19, 1966, 55 y.o.   MRN: [id number]  HPI I injected the patient's left medial epicondyle in July of this year for medial epicondylitis.  He did great after the injection and was pain-free.  However 2 weeks ago, he was helping his friend hanging sheet rock.  He reinjured his left elbow.  He again is having tenderness to palpation just distal to the medial epicondyle.  Resisted wrist flexion and finger flexion along with pronation reproduces the pain.  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   No current facility-administered medications on file prior to visit.   Allergies  Allergen Reactions   Haldol [Haloperidol Lactate] Other (See Comments)    REACTION: Muscle tightness (tension) all over body for 6 hours.    Social History   Socioeconomic History   Marital status: Single    Spouse name: Not on file   Number of children: 5   Years of education: Not on file   Highest education level: High school graduate  Occupational History    Comment: NA  Tobacco Use   Smoking status: Never   Smokeless tobacco: Current    Types: Snuff  Vaping Use   Vaping Use: Never used  Substance and Sexual Activity   Alcohol use: No   Drug use: No   Sexual activity: Yes  Other Topics Concern   Not on file  Social History Narrative   Lives with sig other x 27 years   Caffeine- sweet tea all day   I hike the [employer], play basketball   Social Determinants of Health   Financial Resource Strain: Not on file  Food Insecurity: Not  on file  Transportation Needs: Not on file  Physical Activity: Not on file  Stress: Not on file  Social Connections: Not on file  Intimate Partner Violence: Not on file      Review of Systems     Objective:   Physical Exam Vitals reviewed.  Constitutional:      Appearance: Normal appearance. He is normal weight.  Cardiovascular:     Rate and Rhythm: Normal rate and regular rhythm.     Heart sounds: Normal heart sounds. No murmur heard.   No friction rub. No gallop.  Pulmonary:     Effort: Pulmonary effort is normal. No respiratory distress.     Breath sounds: Normal breath sounds. No stridor. No wheezing or rales.  Musculoskeletal:     Left elbow: Normal range of motion. Tenderness present in medial epicondyle.  Neurological:     Mental Status: He is alert.         Assessment & Plan:  Colon cancer screening - Plan: Cologuard  Medial epicondylitis of left elbow  General medical exam - Plan: CBC with Differential/Platelet, Lipid panel, COMPLETE METABOLIC PANEL WITH GFR, PSA  Prostate cancer screening - Plan: PSA  I believe the patient has medial epicondylitis.  Using sterile technique, I injected around the insertion of the tendon on the medial epicondyle and the medial forearm using 1 cc of 0.1%  lidocaine and 1 cc of 40 mg/mL Kenalog.  The patient tolerated the procedure well without complication.  Patient also informs me that he is losing his insurance at the start of the year.  Therefore he would like to try to update his preventative care.  I recommended that he return fasting next week for a CBC, CMP, and lipid panel.  I will screen for prostate cancer with a PSA.  I am unable to schedule a colonoscopy this quickly however we can try to schedule the patient for Cologuard so that he can get colon cancer screening before he loses his insurance.

## 2021-11-24 ENCOUNTER — Other Ambulatory Visit: Payer: Self-pay

## 2021-11-24 ENCOUNTER — Other Ambulatory Visit: Payer: Medicaid Other

## 2021-11-24 DIAGNOSIS — Z125 Encounter for screening for malignant neoplasm of prostate: Secondary | ICD-10-CM | POA: Diagnosis not present

## 2021-11-24 DIAGNOSIS — Z0000 Encounter for general adult medical examination without abnormal findings: Secondary | ICD-10-CM | POA: Diagnosis not present

## 2021-11-24 LAB — COMPLETE METABOLIC PANEL WITH GFR
AG Ratio: 1.9 (calc) (ref 1.0–2.5)
ALT: 17 U/L (ref 9–46)
AST: 15 U/L (ref 10–35)
Albumin: 4.4 g/dL (ref 3.6–5.1)
Alkaline phosphatase (APISO): 84 U/L (ref 35–144)
BUN: 18 mg/dL (ref 7–25)
CO2: 28 mmol/L (ref 20–32)
Calcium: 9.2 mg/dL (ref 8.6–10.3)
Chloride: 103 mmol/L (ref 98–110)
Creat: 0.87 mg/dL (ref 0.70–1.30)
Globulin: 2.3 g/dL (calc) (ref 1.9–3.7)
Glucose, Bld: 111 mg/dL — ABNORMAL HIGH (ref 65–99)
Potassium: 4.4 mmol/L (ref 3.5–5.3)
Sodium: 139 mmol/L (ref 135–146)
Total Bilirubin: 1 mg/dL (ref 0.2–1.2)
Total Protein: 6.7 g/dL (ref 6.1–8.1)
eGFR: 102 mL/min/{1.73_m2} (ref >=60)

## 2021-11-24 LAB — CBC WITH DIFFERENTIAL/PLATELET
Absolute Monocytes: 561 cells/uL (ref 200–950)
Basophils Absolute: 43 cells/uL (ref 0–200)
Basophils Relative: 0.6 %
Eosinophils Absolute: 28 cells/uL (ref 15–500)
Eosinophils Relative: 0.4 %
HCT: 47.1 % (ref 38.5–50.0)
Hemoglobin: 16.3 g/dL (ref 13.2–17.1)
Lymphs Abs: 1931 cells/uL (ref 850–3900)
MCH: 31.7 pg (ref 27.0–33.0)
MCHC: 34.6 g/dL (ref 32.0–36.0)
MCV: 91.6 fL (ref 80.0–100.0)
MPV: 10.3 fL (ref 7.5–12.5)
Monocytes Relative: 7.9 %
Neutro Abs: 4537 cells/uL (ref 1500–7800)
Neutrophils Relative %: 63.9 %
Platelets: 230 10*3/uL (ref 140–400)
RBC: 5.14 10*6/uL (ref 4.20–5.80)
RDW: 12.2 % (ref 11.0–15.0)
Total Lymphocyte: 27.2 %
WBC: 7.1 10*3/uL (ref 3.8–10.8)

## 2021-11-24 LAB — LIPID PANEL
Cholesterol: 159 mg/dL (ref <200)
HDL: 34 mg/dL — ABNORMAL LOW (ref >=40)
LDL Cholesterol (Calc): 103 mg/dL (calc) — ABNORMAL HIGH
Non-HDL Cholesterol (Calc): 125 mg/dL (calc) (ref <130)
Total CHOL/HDL Ratio: 4.7 (calc) (ref <5.0)
Triglycerides: 127 mg/dL (ref <150)

## 2021-11-24 LAB — PSA: PSA: 0.27 ng/mL (ref <=4.00)

## 2022-02-04 ENCOUNTER — Telehealth: Payer: Self-pay

## 2022-02-04 NOTE — Telephone Encounter (Signed)
Erroneous encounter. Please disregard.

## 2022-02-08 ENCOUNTER — Ambulatory Visit: Payer: Medicaid Other | Admitting: Family Medicine

## 2022-02-08 ENCOUNTER — Other Ambulatory Visit: Payer: Self-pay

## 2022-02-08 ENCOUNTER — Encounter: Payer: Self-pay | Admitting: Family Medicine

## 2022-02-08 VITALS — BP 118/92 | HR 93 | Temp 97.2°F | Resp 18 | Ht 74.5 in | Wt 214.0 lb

## 2022-02-08 DIAGNOSIS — R5383 Other fatigue: Secondary | ICD-10-CM

## 2022-02-08 DIAGNOSIS — M7702 Medial epicondylitis, left elbow: Secondary | ICD-10-CM | POA: Diagnosis not present

## 2022-02-08 NOTE — Progress Notes (Signed)
Subjective:    Patient ID: Thomas Gutierrez, male    DOB: Apr 21, 1966, 56 y.o.   MRN: [id number]  HPI 11/20/21 I injected the patient's left medial epicondyle in July 2022 for medial epicondylitis.  He did great after the injection and was pain-free.  However 2 weeks ago, he was helping his friend hanging sheet rock.  He reinjured his left elbow.  He again is having tenderness to palpation just distal to the medial epicondyle.  Resisted wrist flexion and finger flexion along with pronation reproduces the pain.  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  At that time, my plan was:  I believe the patient has medial epicondylitis.  Using sterile technique, I injected around the insertion of the tendon on the medial epicondyle and the medial forearm using 1 cc of 0.1% lidocaine and 1 cc of 40 mg/mL Kenalog.  The patient tolerated the procedure well without complication.  Patient also informs me that he is losing his insurance at the start of the year.  Therefore he would like to try to update his preventative care.  I recommended that he return fasting next week for a CBC, CMP, and lipid panel.  I will screen for prostate cancer with a PSA.  I am unable to schedule a colonoscopy this quickly however we can try to schedule the patient for Cologuard so that he can get colon cancer screening before he loses his insurance.  02/08/22   Pain returned about a week ago.  He continues to have pain over the medial epicondyle.  Gripping objects makes the pain worse.  He has been wearing an elbow strap with minimal relief.  He denies any numbness or tingling in his hands.                    Past Medical History:  Diagnosis Date   Vertigo    Past Surgical History:  Procedure Laterality Date   NASAL SEPTOPLASTY W/ TURBINOPLASTY Bilateral 07/21/2020   Procedure: NASAL SEPTOPLASTY WITH BILATERAL  TURBINATE REDUCTION;  Surgeon: Leta Baptist, MD;  Location: [place];   Service: ENT;  Laterality: Bilateral;   No current outpatient medications on file prior to visit.   No current facility-administered medications on file prior to visit.   Allergies  Allergen Reactions   Haldol [Haloperidol Lactate] Other (See Comments)    REACTION: Muscle tightness (tension) all over body for 6 hours.    Social History   Socioeconomic History   Marital status: Single    Spouse name: Not on file   Number of children: 5   Years of education: Not on file   Highest education level: High school graduate  Occupational History    Comment: NA  Tobacco Use   Smoking status: Never   Smokeless tobacco: Current    Types: Snuff  Vaping Use   Vaping Use: Never used  Substance and Sexual Activity   Alcohol use: No   Drug use: No   Sexual activity: Yes  Other Topics Concern   Not on file  Social History Narrative   Lives with sig other x 27 years   Caffeine- sweet tea all day   I hike the [employer], play basketball   Social Determinants of Health   Financial Resource Strain: Not on file  Food Insecurity: Not on file  Transportation Needs: Not on file  Physical Activity: Not on file  Stress: Not on file  Social Connections: Not on file  Intimate Partner Violence: Not on file      Review of Systems     Objective:   Physical Exam Vitals reviewed.  Constitutional:      Appearance: Normal appearance. He is normal weight.  Cardiovascular:     Rate and Rhythm: Normal rate and regular rhythm.     Heart sounds: Normal heart sounds. No murmur heard.   No friction rub. No gallop.  Pulmonary:     Effort: Pulmonary effort is normal. No respiratory distress.     Breath sounds: Normal breath sounds. No stridor. No wheezing or rales.  Musculoskeletal:     Left elbow: Normal range of motion. Tenderness present in medial epicondyle.  Neurological:     Mental Status: He is alert.         Assessment & Plan:  Medial epicondylitis of left elbow - Plan:  Ambulatory referral to Orthopedic Surgery  Fatigue, unspecified type - Plan: Testosterone Total,Free,Bio, Males I believe the patient may have underlying tendinosis in the flexor tendon attached to the medial epicondyle.  I recommend an orthopedic consultation for diagnostic ultrasound and to determine if he will be a candidate for topical nitroglycerin patches or platelet rich plasma injections.  Meanwhile continue to wear the elbow strap.  He also reports some erectile dysfunction and fatigue so we will check a testosterone level.

## 2022-02-09 LAB — TESTOSTERONE TOTAL,FREE,BIO, MALES
Albumin: 4.4 g/dL (ref 3.6–5.1)
Sex Hormone Binding: 24 nmol/L (ref 10–50)
Testosterone, Bioavailable: 149.5 ng/dL (ref 110.0–575.0)
Testosterone, Free: 74.3 pg/mL (ref 46.0–224.0)
Testosterone: 433 ng/dL (ref 250–827)

## 2022-02-23 DIAGNOSIS — G5622 Lesion of ulnar nerve, left upper limb: Secondary | ICD-10-CM | POA: Diagnosis not present

## 2022-03-01 DIAGNOSIS — M25522 Pain in left elbow: Secondary | ICD-10-CM | POA: Diagnosis not present

## 2022-03-05 DIAGNOSIS — M7702 Medial epicondylitis, left elbow: Secondary | ICD-10-CM | POA: Insufficient documentation

## 2022-03-10 DIAGNOSIS — G5622 Lesion of ulnar nerve, left upper limb: Secondary | ICD-10-CM | POA: Diagnosis not present

## 2022-03-10 DIAGNOSIS — M7702 Medial epicondylitis, left elbow: Secondary | ICD-10-CM | POA: Diagnosis not present

## 2022-03-12 DIAGNOSIS — M25522 Pain in left elbow: Secondary | ICD-10-CM | POA: Diagnosis not present

## 2022-03-19 DIAGNOSIS — M25522 Pain in left elbow: Secondary | ICD-10-CM | POA: Diagnosis not present

## 2022-03-26 DIAGNOSIS — M25522 Pain in left elbow: Secondary | ICD-10-CM | POA: Diagnosis not present

## 2022-04-09 DIAGNOSIS — M25522 Pain in left elbow: Secondary | ICD-10-CM | POA: Diagnosis not present

## 2022-05-10 ENCOUNTER — Ambulatory Visit: Payer: Medicaid Other | Admitting: Family Medicine

## 2022-05-10 VITALS — HR 83 | Temp 98.7°F | Ht 75.0 in | Wt 214.2 lb

## 2022-05-10 DIAGNOSIS — M7581 Other shoulder lesions, right shoulder: Secondary | ICD-10-CM | POA: Diagnosis not present

## 2022-05-10 DIAGNOSIS — M25512 Pain in left shoulder: Secondary | ICD-10-CM | POA: Diagnosis not present

## 2022-05-10 DIAGNOSIS — G8929 Other chronic pain: Secondary | ICD-10-CM

## 2022-05-10 DIAGNOSIS — R5383 Other fatigue: Secondary | ICD-10-CM

## 2022-05-10 DIAGNOSIS — M7702 Medial epicondylitis, left elbow: Secondary | ICD-10-CM

## 2022-05-10 DIAGNOSIS — M25511 Pain in right shoulder: Secondary | ICD-10-CM | POA: Diagnosis not present

## 2022-05-10 MED ORDER — SILDENAFIL CITRATE 100 MG PO TABS: 50.0000 mg | ORAL_TABLET | Freq: Every day | ORAL | 11 refills | Status: DC | PRN

## 2022-05-10 MED ORDER — MELOXICAM 15 MG PO TABS: 15.0000 mg | ORAL_TABLET | Freq: Every day | ORAL | 0 refills | Status: DC

## 2022-05-10 NOTE — Progress Notes (Signed)
Subjective:    Patient ID: Thomas Gutierrez, male    DOB: 08-31-1966, 56 y.o.   MRN: [id number]  HPI 11/20/21 I injected the patient's left medial epicondyle in July 2022 for medial epicondylitis.  He did great after the injection and was pain-free.  However 2 weeks ago, he was helping his friend hanging sheet rock.  He reinjured his left elbow.  He again is having tenderness to palpation just distal to the medial epicondyle.  Resisted wrist flexion and finger flexion along with pronation reproduces the pain.  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  At that time, my plan was:  I believe the patient has medial epicondylitis.  Using sterile technique, I injected around the insertion of the tendon on the medial epicondyle and the medial forearm using 1 cc of 0.1% lidocaine and 1 cc of 40 mg/mL Kenalog.  The patient tolerated the procedure well without complication.  Patient also informs me that he is losing his insurance at the start of the year.  Therefore he would like to try to update his preventative care.  I recommended that he return fasting next week for a CBC, CMP, and lipid panel.  I will screen for prostate cancer with a PSA.  I am unable to schedule a colonoscopy this quickly however we can try to schedule the patient for Cologuard so that he can get colon cancer screening before he loses his insurance.  02/08/22 Pain returned about a week ago.  He continues to have pain over the medial epicondyle.  Gripping objects makes the pain worse.  He has been wearing an elbow strap with minimal relief.  He denies any numbness or tingling in his hands.  At that time, my plan was: I believe the patient may have underlying tendinosis in the flexor tendon attached to the medial epicondyle.  I recommend an orthopedic consultation for diagnostic ultrasound and to determine if he will be a candidate for topical nitroglycerin patches or platelet rich plasma injections.  Meanwhile continue to  wear the elbow strap.  He also reports some erectile dysfunction and fatigue so we will check a testosterone level.  05/10/22 Patient states that he is no better.  He saw orthopedics who performed physical therapy but the physical therapy gave him no benefit.  He states that the physical therapy was "a joke".  He continues to have pain over the medial epicondyles of both elbows.  He also complains of pain in his shoulders right greater than left.  It hurts to lift his arm greater than 90 degrees.  Cortisone shot I gave him helped him for a few months but the pain came back.  He does autobody restoration.  This essentially requires him to be repetitive past such as standing often with his arms abducted to 90 degrees of shoulder.  I believe the repetitive motion is irritating his shoulder.  He attributes the shoulder pain to an injury that he suffered while throwing a football.  However he continues to have pain in his shoulder with abduction greater than 90 degrees.  Past Medical History:  Diagnosis Date   Vertigo    Past Surgical History:  Procedure Laterality Date   NASAL SEPTOPLASTY W/ TURBINOPLASTY Bilateral 07/21/2020   Procedure: NASAL SEPTOPLASTY WITH BILATERAL  TURBINATE REDUCTION;  Surgeon: Leta Baptist, MD;  Location: [place];  Service: ENT;  Laterality: Bilateral;   No current outpatient medications on file prior to visit.   No current facility-administered  medications on file prior to visit.   Allergies  Allergen Reactions   Haldol [Haloperidol Lactate] Other (See Comments)    REACTION: Muscle tightness (tension) all over body for 6 hours.    Social History   Socioeconomic History   Marital status: Single    Spouse name: Not on file   Number of children: 5   Years of education: Not on file   Highest education level: High school graduate  Occupational History    Comment: NA  Tobacco Use   Smoking status: Never   Smokeless tobacco: Current    Types: Snuff   Vaping Use   Vaping Use: Never used  Substance and Sexual Activity   Alcohol use: No   Drug use: No   Sexual activity: Yes  Other Topics Concern   Not on file  Social History Narrative   Lives with sig other x 27 years   Caffeine- sweet tea all day   I hike the [employer], play basketball   Social Determinants of Health   Financial Resource Strain: Not on file  Food Insecurity: Not on file  Transportation Needs: Not on file  Physical Activity: Not on file  Stress: Not on file  Social Connections: Not on file  Intimate Partner Violence: Not on file      Review of Systems     Objective:   Physical Exam Vitals reviewed.  Constitutional:      Appearance: Normal appearance. He is normal weight.  Cardiovascular:     Rate and Rhythm: Normal rate and regular rhythm.     Heart sounds: Normal heart sounds. No murmur heard.   No friction rub. No gallop.  Pulmonary:     Effort: Pulmonary effort is normal. No respiratory distress.     Breath sounds: Normal breath sounds. No stridor. No wheezing or rales.  Musculoskeletal:     Right shoulder: Decreased range of motion. Decreased strength.     Left shoulder: Decreased range of motion. Decreased strength.     Left elbow: Normal range of motion. Tenderness present in medial epicondyle.  Neurological:     Mental Status: He is alert.         Assessment & Plan:  Fatigue, unspecified type - Plan: TSH, Vitamin B12  Medial epicondylitis of left elbow  Tendinitis of right rotator cuff - Plan: MR Shoulder Right Wo Contrast  Chronic pain of both shoulders - Plan: MR Shoulder Right Wo Contrast At this point, I am not sure how much of the shoulder pain is doing chronic osteoarthritis and how much could be due to tendinitis or even tendon rupture versus chronic bursitis in the shoulder.  Therefore I feel that I need an MRI of the right shoulder to further evaluate.  In the meantime I will start the patient on meloxicam 15 mg  daily to try to help with pain in both elbows and both suspected degenerative this is due to repetitive chronic overuse injury.  In addition, the patient reports fatigue.  His testosterone levels are normal.  Therefore I will check a B12 and a TSH as his CBC and CMP were normal.  If this is normal I would recommend evaluation for sleep apnea

## 2022-05-11 LAB — TSH: TSH: 1.19 mIU/L (ref 0.40–4.50)

## 2022-05-11 LAB — VITAMIN B12: Vitamin B-12: 375 pg/mL (ref 200–1100)

## 2022-05-27 ENCOUNTER — Other Ambulatory Visit: Payer: Medicaid Other

## 2022-05-30 ENCOUNTER — Other Ambulatory Visit: Payer: Self-pay | Admitting: Family Medicine

## 2022-05-30 DIAGNOSIS — G8929 Other chronic pain: Secondary | ICD-10-CM

## 2022-06-03 ENCOUNTER — Telehealth: Payer: Self-pay

## 2022-06-03 NOTE — Telephone Encounter (Signed)
Starr from [facility] called in about this pts having a peer to peer consultation with pcp for this pt. By calling this number [phone] provider should be able to have a peer to peer over the phone. Please advise.  Cb#: [phone]

## 2022-06-24 DIAGNOSIS — M25512 Pain in left shoulder: Secondary | ICD-10-CM | POA: Diagnosis not present

## 2022-06-24 DIAGNOSIS — M25511 Pain in right shoulder: Secondary | ICD-10-CM | POA: Diagnosis not present

## 2022-06-24 DIAGNOSIS — M542 Cervicalgia: Secondary | ICD-10-CM | POA: Diagnosis not present

## 2022-07-09 ENCOUNTER — Ambulatory Visit
Admission: EM | Admit: 2022-07-09 | Discharge: 2022-07-09 | Disposition: A | Discharge status: Home / Self Care | Payer: Medicaid Other | Attending: Physician Assistant | Admitting: Physician Assistant

## 2022-07-09 ENCOUNTER — Ambulatory Visit: Payer: Self-pay | Admitting: *Deleted

## 2022-07-09 DIAGNOSIS — L02419 Cutaneous abscess of limb, unspecified: Secondary | ICD-10-CM | POA: Diagnosis not present

## 2022-07-09 MED ORDER — CEPHALEXIN 250 MG PO CAPS: 250.0000 mg | ORAL_CAPSULE | Freq: Four times a day (QID) | ORAL | 0 refills | Status: DC

## 2022-07-09 NOTE — ED Triage Notes (Signed)
Pt c/o 2 knots under left arm first noticed yesterday. Denies drainage but says it is very painful.

## 2022-07-09 NOTE — Telephone Encounter (Signed)
Summary: swollen, painful lymph nodes   Pt called in stating that he has some swollen and painful lymph nodes in his armpit area.   Cb#: [phone]      Chief Complaint: two swollen lymph notes Symptoms: painful and red Frequency: constant Pertinent Negatives: Patient denies fever Disposition: '[]'$ ED /'[x]'$ Urgent Care (no appt availability in office) / '[]'$ Appointment(In office/virtual)/ '[]'$  [facility]/ '[]'$ Home Care/ '[]'$ Refused Recommended Disposition /'[]'$ [facility]/ '[]'$  Follow-up with PCP Additional Notes: Pt has appt in office next week. Pt advised to go to UC. He is driving through and is near [place] and states he plans to go there.  Reason for Disposition  [1] Tender node in the armpit AND [2] has a sore, scratch, cut or painful red area on that arm  Answer Assessment - Initial Assessment Questions 1. LOCATION: "Where is the swollen node located?" "Is the matching node on the other side of the body also swollen?"      Left underarm has 2 swollen nodes 2. SIZE: "How big is the node?" (e.g., inches or centimeters; or compared to common objects such as pea, bean, marble, golf ball)      marble 3. ONSET: "When did the swelling start?"      yesterday 4. NECK NODES: "Is there a sore throat, runny nose or other symptoms of a cold?"      no 5. GROIN OR ARMPIT NODES: "Is there a sore, scratch, cut or painful red area on that arm or leg?"      They are red and painful 6. FEVER: "Do you have a fever?" If Yes, ask: "What is it, how was it measured, and when did it start?"      no 7. CAUSE: "What do you think is causing the swollen lymph nodes?"     Just finished steroids pack 8. OTHER SYMPTOMS: "Do you have any other symptoms?"     no 9. PREGNANCY: "Is there any chance you are pregnant?" "When was your last menstrual period?"     na  Protocols used: Lymph Nodes - Swollen-A-AH

## 2022-07-09 NOTE — Discharge Instructions (Addendum)
  Take antibiotics as prescribed.   Follow up here or with PCP if symptoms do not improve over the weekend.

## 2022-07-09 NOTE — ED Provider Notes (Signed)
[facility]    CSN: [id number] Arrival date & time: 07/09/22  7169      History   Chief Complaint Chief Complaint  Patient presents with   Abscess    HPI Thomas Gutierrez is a 56 y.o. male.   Patient here today for evaluation of "knots" to his left axillary area. He first noticed this yesterday. He has not had fever, nausea or vomiting. He reports the area is tender to touch. He does not report any treatment.   The history is provided by the patient.  Abscess Associated symptoms: no fever, no nausea and no vomiting   Left   Past Medical History:  Diagnosis Date   Vertigo     Patient Active Problem List   Diagnosis Date Noted   Cerebral thrombosis with cerebral infarction 03/06/2020   Cerebral embolism with cerebral infarction 03/06/2020   Subarachnoid hemorrhage 03/06/2020   Intracerebral hemorrhage 03/06/2020   Right upper extremity numbness 03/05/2020    Past Surgical History:  Procedure Laterality Date   NASAL SEPTOPLASTY W/ TURBINOPLASTY Bilateral 07/21/2020   Procedure: NASAL SEPTOPLASTY WITH BILATERAL  TURBINATE REDUCTION;  Surgeon: Leta Baptist, MD;  Location: [facility] [facility];  Service: ENT;  Laterality: Bilateral;       Home Medications    Prior to Admission medications   Medication Sig Start Date End Date Taking? Authorizing Provider  cephALEXin (KEFLEX) 250 MG capsule Take 1 capsule (250 mg total) by mouth 4 (four) times daily. 07/09/22  Yes Francene Finders, PA-C  meloxicam (MOBIC) 15 MG tablet Take 1 tablet (15 mg total) by mouth daily. 05/10/22   Susy Frizzle, MD  sildenafil (VIAGRA) 100 MG tablet Take 0.5-1 tablets (50-100 mg total) by mouth daily as needed for erectile dysfunction. 05/10/22   Susy Frizzle, MD    Family History Family History  Problem Relation Age of Onset   Cancer Mother        breast   Cancer Father 39       lung   Cancer Sister        breast    Social History Social History   Tobacco Use    Smoking status: Never   Smokeless tobacco: Current    Types: Snuff  Vaping Use   Vaping Use: Never used  Substance Use Topics   Alcohol use: No   Drug use: No     Allergies   Haldol [haloperidol lactate]   Review of Systems Review of Systems  Constitutional:  Negative for chills and fever.  Eyes:  Negative for discharge and redness.  Respiratory:  Negative for shortness of breath.   Gastrointestinal:  Negative for nausea and vomiting.  Skin:  Positive for color change. Negative for wound.  Neurological:  Negative for numbness.     Physical Exam Triage Vital Signs ED Triage Vitals [07/09/22 0934]  Enc Vitals Group     BP (!) 145/95     Pulse Rate 80     Resp 18     Temp 98 F (36.7 C)     Temp Source Oral     SpO2 97 %     Weight      Height      Head Circumference      Peak Flow      Pain Score 0     Pain Loc      Pain Edu?      Excl. in [place]?    No data found.  Updated Vital Signs BP (!) 145/95 (BP Location: Left Arm)   Pulse 80   Temp 98 F (36.7 C) (Oral)   Resp 18   SpO2 97%      Physical Exam Vitals and nursing note reviewed.  Constitutional:      General: He is not in acute distress.    Appearance: Normal appearance. He is not ill-appearing.  HENT:     Head: Normocephalic and atraumatic.  Eyes:     Conjunctiva/sclera: Conjunctivae normal.  Cardiovascular:     Rate and Rhythm: Normal rate.  Pulmonary:     Effort: Pulmonary effort is normal.  Skin:    Comments: Few ~ 1-2 cm areas of induration and erythema noted to left axilla, no bleeding or drainage noted  Neurological:     Mental Status: He is alert.  Psychiatric:        Mood and Affect: Mood normal.        Behavior: Behavior normal.        Thought Content: Thought content normal.      UC Treatments / Results  Labs (all labs ordered are listed, but only abnormal results are displayed) Labs Reviewed - No data to display  EKG   Radiology No results  found.  Procedures Procedures (including critical care time)  Medications Ordered in UC Medications - No data to display  Initial Impression / Assessment and Plan / UC Course  I have reviewed the triage vital signs and the nursing notes.  Pertinent labs & imaging results that were available during my care of the patient were reviewed by me and considered in my medical decision making (see chart for details).    Suspect small abscess vs folliculitis given presentation. I and D not indicated at this time. Will treat with oral antibiotics and encouraged follow up with any further concerns or worsening symptoms.   Final Clinical Impressions(s) / UC Diagnoses   Final diagnoses:  Axillary abscess     Discharge Instructions       Take antibiotics as prescribed.   Follow up here or with PCP if symptoms do not improve over the weekend.       ED Prescriptions     Medication Sig Dispense Auth. Provider   cephALEXin (KEFLEX) 250 MG capsule Take 1 capsule (250 mg total) by mouth 4 (four) times daily. 28 capsule Francene Finders, PA-C      PDMP not reviewed this encounter.   Francene Finders, PA-C 07/09/22 1011

## 2022-07-12 ENCOUNTER — Ambulatory Visit: Payer: Medicaid Other | Admitting: Family Medicine

## 2022-07-12 VITALS — BP 116/86 | HR 96 | Temp 98.5°F | Ht 75.0 in | Wt 213.0 lb

## 2022-07-12 DIAGNOSIS — L02412 Cutaneous abscess of left axilla: Secondary | ICD-10-CM

## 2022-07-12 MED ORDER — SULFAMETHOXAZOLE-TRIMETHOPRIM 800-160 MG PO TABS: 1.0000 | ORAL_TABLET | Freq: Two times a day (BID) | ORAL | 0 refills | Status: DC

## 2022-07-12 NOTE — Progress Notes (Signed)
Subjective:    Patient ID: Thomas Gutierrez, male    DOB: 02-21-1966, 56 y.o.   MRN: [id number]  HPI Patient has 2 small boils in his left axilla.  They have been there for several days.  He went to an urgent care last week and was started on Keflex.  They are gradually getting larger and hurting more.  One is 1.5 cm in diameter.  The smaller boil is 1 cm in diameter.  Both are erythematous warm and tender.  I am able to express a small amount of pus from the larger boil Past Medical History:  Diagnosis Date   Vertigo    Past Surgical History:  Procedure Laterality Date   NASAL SEPTOPLASTY W/ TURBINOPLASTY Bilateral 07/21/2020   Procedure: NASAL SEPTOPLASTY WITH BILATERAL  TURBINATE REDUCTION;  Surgeon: Leta Baptist, MD;  Location: [place];  Service: ENT;  Laterality: Bilateral;   Current Outpatient Medications on File Prior to Visit  Medication Sig Dispense Refill   cephALEXin (KEFLEX) 250 MG capsule Take 1 capsule (250 mg total) by mouth 4 (four) times daily. 28 capsule 0   meloxicam (MOBIC) 15 MG tablet Take 1 tablet (15 mg total) by mouth daily. 30 tablet 0   sildenafil (VIAGRA) 100 MG tablet Take 0.5-1 tablets (50-100 mg total) by mouth daily as needed for erectile dysfunction. 5 tablet 11   No current facility-administered medications on file prior to visit.   Allergies  Allergen Reactions   Haldol [Haloperidol Lactate] Other (See Comments)    REACTION: Muscle tightness (tension) all over body for 6 hours.    Social History   Socioeconomic History   Marital status: Single    Spouse name: Not on file   Number of children: 5   Years of education: Not on file   Highest education level: High school graduate  Occupational History    Comment: NA  Tobacco Use   Smoking status: Never   Smokeless tobacco: Current    Types: Snuff  Vaping Use   Vaping Use: Never used  Substance and Sexual Activity   Alcohol use: No   Drug use: No   Sexual activity: Yes  Other  Topics Concern   Not on file  Social History Narrative   Lives with sig other x 27 years   Caffeine- sweet tea all day   I hike the [employer], play basketball   Social Determinants of Health   Financial Resource Strain: Not on file  Food Insecurity: Not on file  Transportation Needs: Not on file  Physical Activity: Not on file  Stress: Not on file  Social Connections: Not on file  Intimate Partner Violence: Not on file      Review of Systems     Objective:   Physical Exam Vitals reviewed.  Constitutional:      Appearance: Normal appearance. He is normal weight.  Cardiovascular:     Rate and Rhythm: Normal rate and regular rhythm.     Heart sounds: Normal heart sounds. No murmur heard.    No friction rub. No gallop.  Pulmonary:     Effort: Pulmonary effort is normal. No respiratory distress.     Breath sounds: Normal breath sounds. No stridor. No wheezing or rales.  Musculoskeletal:     Left elbow: Normal range of motion. Tenderness present in medial epicondyle.       Arms:  Skin:    Findings: Erythema, lesion and rash present. Rash is pustular.  Neurological:  Mental Status: He is alert.          Assessment & Plan:  Abscess of axilla, left I anesthetized each abscess with 0.1% lidocaine with epinephrine.  I then made a 1 cm horizontal incision in each abscess, open the incision with a pair hemostats and expressed pus.  I then packed the larger abscess with 2 inches 1/4 inch iodoform gauze.  The smaller abscess did not require this.  Begin Bactrim double strength tablets twice daily for 7 days and discontinue Keflex

## 2022-07-16 ENCOUNTER — Telehealth: Payer: Self-pay

## 2022-07-16 NOTE — Telephone Encounter (Signed)
Pt called stating that abscess is still oozing just tiny bit onto the dressing/bandages (still little blooding). Per pt, for the most part-it has healed up pretty will minus a tiny hole which is where it's oozing from. Pt also feels weak/run down and has little energy. He is done w/the antibiotics.   Pt is having his grands kids over this weekend, can he so swimming? And is he still considered contagious?  Please advice

## 2022-07-16 NOTE — Telephone Encounter (Signed)
Spoke with pt per Dr. Dennard Schaumann, should fine to go swimming.    Pt aware/voiced understanding

## 2022-07-29 ENCOUNTER — Telehealth: Payer: Self-pay

## 2022-07-29 NOTE — Telephone Encounter (Signed)
Spoke with pt, he stated that both his arm pits are swollen. Per pt has no pain/fever.   Pt schedule to come in tom.,07/30/22 at 4:15pm

## 2022-07-29 NOTE — Telephone Encounter (Signed)
Pt called in stating that he wanted to speak with the nurse about another possible boil found under his arm. Pt states that he was recently seen for a different boil and was given antibiotic for this. Pt is afraid that he may be getting a staph infection. Please advise.  Cb#: [phone]

## 2022-07-30 ENCOUNTER — Ambulatory Visit: Payer: Medicaid Other | Admitting: Family Medicine

## 2022-07-30 VITALS — BP 118/80 | HR 99 | Temp 98.9°F | Ht 75.0 in | Wt 216.0 lb

## 2022-07-30 DIAGNOSIS — L02411 Cutaneous abscess of right axilla: Secondary | ICD-10-CM

## 2022-07-30 DIAGNOSIS — R590 Localized enlarged lymph nodes: Secondary | ICD-10-CM | POA: Diagnosis not present

## 2022-07-30 MED ORDER — SULFAMETHOXAZOLE-TRIMETHOPRIM 800-160 MG PO TABS: 1.0000 | ORAL_TABLET | Freq: Two times a day (BID) | ORAL | 0 refills | Status: DC

## 2022-07-30 MED ORDER — DOXYCYCLINE HYCLATE 100 MG PO TABS: 100.0000 mg | ORAL_TABLET | Freq: Two times a day (BID) | ORAL | 0 refills | Status: DC

## 2022-07-30 NOTE — Progress Notes (Signed)
Subjective:    Patient ID: Thomas Gutierrez, male    DOB: 1966/08/03, 56 y.o.   MRN: [id number]  HPI Recently I saw the patient and had to lance the boil in his left axilla.  I started him on Bactrim.  These have improved dramatically and are resolving.  However shortly after stopping the antibiotic he developed a similar ball in his right axilla.  Today on exam, there is a mildly erythematous 2 cm nodule forming in his right axilla.  There is also a swollen lymph node which is likely reactive.  The patient states he can feel the lymph node.  I am unable to appreciate the lymph node today on exam.  I do appreciate the boil forming in his right axilla Past Medical History:  Diagnosis Date   Vertigo    Past Surgical History:  Procedure Laterality Date   NASAL SEPTOPLASTY W/ TURBINOPLASTY Bilateral 07/21/2020   Procedure: NASAL SEPTOPLASTY WITH BILATERAL  TURBINATE REDUCTION;  Surgeon: Leta Baptist, MD;  Location: [facility];  Service: ENT;  Laterality: Bilateral;   Current Outpatient Medications on File Prior to Visit  Medication Sig Dispense Refill   cephALEXin (KEFLEX) 250 MG capsule Take 1 capsule (250 mg total) by mouth 4 (four) times daily. 28 capsule 0   meloxicam (MOBIC) 15 MG tablet Take 1 tablet (15 mg total) by mouth daily. 30 tablet 0   sildenafil (VIAGRA) 100 MG tablet Take 0.5-1 tablets (50-100 mg total) by mouth daily as needed for erectile dysfunction. 5 tablet 11   sulfamethoxazole-trimethoprim (BACTRIM DS) 800-160 MG tablet Take 1 tablet by mouth 2 (two) times daily. 14 tablet 0   No current facility-administered medications on file prior to visit.   Allergies  Allergen Reactions   Haldol [Haloperidol Lactate] Other (See Comments)    REACTION: Muscle tightness (tension) all over body for 6 hours.    Social History   Socioeconomic History   Marital status: Single    Spouse name: Not on file   Number of children: 5   Years of education: Not on file   Highest  education level: High school graduate  Occupational History    Comment: NA  Tobacco Use   Smoking status: Never   Smokeless tobacco: Current    Types: Snuff  Vaping Use   Vaping Use: Never used  Substance and Sexual Activity   Alcohol use: No   Drug use: No   Sexual activity: Yes  Other Topics Concern   Not on file  Social History Narrative   Lives with sig other x 27 years   Caffeine- sweet tea all day   I hike the [employer], play basketball   Social Determinants of Health   Financial Resource Strain: Not on file  Food Insecurity: Not on file  Transportation Needs: Not on file  Physical Activity: Not on file  Stress: Not on file  Social Connections: Not on file  Intimate Partner Violence: Not on file      Review of Systems     Objective:   Physical Exam Vitals reviewed.  Constitutional:      Appearance: Normal appearance. He is normal weight.  Cardiovascular:     Rate and Rhythm: Normal rate and regular rhythm.     Heart sounds: Normal heart sounds. No murmur heard.    No friction rub. No gallop.  Pulmonary:     Effort: Pulmonary effort is normal. No respiratory distress.     Breath sounds: Normal breath  sounds. No stridor. No wheezing or rales.  Musculoskeletal:       Arms:  Lymphadenopathy:     Upper Body:     Right upper body: Axillary adenopathy present.  Skin:    Findings: Erythema, lesion and rash present. Rash is pustular.  Neurological:     Mental Status: He is alert.          Assessment & Plan:  LAD (lymphadenopathy), axillary - Plan: [place] AXILLA RIGHT  Abscess of axilla, right I do not appreciate the lymphadenopathy in the right axilla.  I believe it would be reactive to the boil.  However the patient is very concerned because his mother died in her early 74s due to [place].  Therefore he would like an ultrasound of his axilla just to rule out any pathologic lymph nodes.  I believe that this is a reasonable request on his part and  would give him peace of mind.  I do believe that he is call the current infection early enough that he does not require incision and drainage.  Given the fact he was recently on Bactrim I will start him on doxycycline 100 mg p.o. twice daily for 10 days.  I believe he likely infected himself with contaminated deodorant.  Therefore I recommended that he trash anything that touched each axilla to avoid cross-contamination

## 2022-08-09 ENCOUNTER — Ambulatory Visit (HOSPITAL_COMMUNITY): Admission: RE | Admit: 2022-08-09 | Payer: Medicaid Other | Source: Ambulatory Visit

## 2022-08-16 ENCOUNTER — Ambulatory Visit (HOSPITAL_COMMUNITY)
Admission: RE | Admit: 2022-08-16 | Discharge: 2022-08-16 | Disposition: A | Discharge status: Home / Self Care | Payer: Medicaid Other | Source: Ambulatory Visit | Attending: Family Medicine | Admitting: Family Medicine

## 2022-08-16 DIAGNOSIS — R590 Localized enlarged lymph nodes: Secondary | ICD-10-CM | POA: Diagnosis present

## 2022-08-17 ENCOUNTER — Telehealth: Payer: Self-pay

## 2022-08-17 NOTE — Telephone Encounter (Signed)
Pt called in concerned about his results from an MRI pt had recently. Pt states that he has not heard anything as of  yet. Pt is concerned because he was told that there may be a "spot" that was seen. Pt would like a cb about this please.    Cb#: [phone]

## 2022-08-19 ENCOUNTER — Telehealth: Payer: Self-pay

## 2022-08-19 NOTE — Telephone Encounter (Signed)
Pt called asking if you have seen the [place] report yet that he had done on 08/16/2022? Thank you.

## 2022-08-20 ENCOUNTER — Telehealth: Payer: Self-pay

## 2022-08-20 NOTE — Telephone Encounter (Signed)
FYI  Called spoke w/[place] dept Per [place] tech will tried to give it to provider to read the [place] report again and let us know.

## 2022-08-20 NOTE — Telephone Encounter (Signed)
Pt called this morning, stating that he is very concern about his axilla [place]. Per pt due to the facial expression of the Rad tech given him notion that something is wrong. Pt request if you can pls find out the results.   Pls advice

## 2022-09-14 ENCOUNTER — Ambulatory Visit: Payer: Medicaid Other | Admitting: Family Medicine

## 2022-09-14 VITALS — BP 128/82 | HR 96 | Ht 75.0 in | Wt 215.2 lb

## 2022-09-14 DIAGNOSIS — M7701 Medial epicondylitis, right elbow: Secondary | ICD-10-CM

## 2022-09-14 DIAGNOSIS — D234 Other benign neoplasm of skin of scalp and neck: Secondary | ICD-10-CM | POA: Diagnosis not present

## 2022-09-14 DIAGNOSIS — M25511 Pain in right shoulder: Secondary | ICD-10-CM | POA: Diagnosis not present

## 2022-09-14 DIAGNOSIS — M7702 Medial epicondylitis, left elbow: Secondary | ICD-10-CM

## 2022-09-14 DIAGNOSIS — G8929 Other chronic pain: Secondary | ICD-10-CM

## 2022-09-14 DIAGNOSIS — M25512 Pain in left shoulder: Secondary | ICD-10-CM

## 2022-09-14 MED ORDER — CELECOXIB 200 MG PO CAPS: 200.0000 mg | ORAL_CAPSULE | Freq: Two times a day (BID) | ORAL | 11 refills | Status: AC

## 2022-09-14 NOTE — Progress Notes (Signed)
Subjective:    Patient ID: Thomas Gutierrez, male    DOB: [birth year], 56 y.o.   MRN: [id number]  HPI Patient presents today with a multitude of concerns.  I have previously injected his left lateral epicondyle for epicondylitis.  He continues to have some mild pain in that elbow.  He has now developed tenderness around the right lateral epicondyle.  He also has some numbness in his hands at night when he sleeps however it sounds like he may be developing carpal tunnel syndrome rather than cervical radiculopathy because I believe that the fifth finger is not involved.  The numbness also goes away when he shakes his hands and changes positions.  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  He also does Architect.  He also complains of pain in his right shoulder.  I have previously injected his right subacromial space due to tendinitis in his right shoulder.  He started to get an aching pain in his right shoulder and in his left shoulder with abduction.  He also has a trigger finger in the third and fourth fingers on his left hand.  His MCP joint will occasionally lock when flexed.  He also reports a mole on the back of his head that he wants removed.  In the center of his occiput, is a 7 mm fleshy skin tag nodule that has been there his entire life.  Is getting irritated every time he has a haircut and he would like me to remove it Past Medical History:  Diagnosis Date   Vertigo    Past Surgical History:  Procedure Laterality Date   NASAL SEPTOPLASTY W/ TURBINOPLASTY Bilateral 07/21/2020   Procedure: NASAL SEPTOPLASTY WITH BILATERAL  TURBINATE REDUCTION;  Surgeon: Leta Baptist, MD;  Location: [place];  Service: ENT;  Laterality: Bilateral;   Current Outpatient Medications on File Prior to Visit  Medication Sig Dispense Refill   meloxicam (MOBIC) 15 MG tablet Take 1 tablet (15 mg total) by mouth daily.  30 tablet 0   sildenafil (VIAGRA) 100 MG tablet Take 0.5-1 tablets (50-100 mg total) by mouth daily as needed for erectile dysfunction. 5 tablet 11   No current facility-administered medications on file prior to visit.   Allergies  Allergen Reactions   Haldol [Haloperidol Lactate] Other (See Comments)    REACTION: Muscle tightness (tension) all over body for 6 hours.    Social History   Socioeconomic History   Marital status: Single    Spouse name: Not on file   Number of children: 5   Years of education: Not on file   Highest education level: High school graduate  Occupational History    Comment: NA  Tobacco Use   Smoking status: Never   Smokeless tobacco: Current    Types: Snuff  Vaping Use   Vaping Use: Never used  Substance and Sexual Activity   Alcohol use: No   Drug use: No   Sexual activity: Yes  Other Topics Concern   Not on file  Social History Narrative   Lives with sig other x 27 years   Caffeine- sweet tea all day   I hike the [employer], play basketball   Social Determinants of Health   Financial Resource Strain: Not on file  Food Insecurity: Not on file  Transportation Needs: Not on file  Physical Activity: Not on file  Stress: Not on file  Social Connections: Not on file  Intimate Partner Violence: Not on file      Review of Systems     Objective:   Physical Exam Vitals reviewed.  Constitutional:      Appearance: Normal appearance. He is normal weight.  Cardiovascular:     Rate and Rhythm: Normal rate and regular rhythm.     Heart sounds: Normal heart sounds. No murmur heard.    No friction rub. No gallop.  Pulmonary:     Effort: Pulmonary effort is normal. No respiratory distress.     Breath sounds: Normal breath sounds. No stridor. No wheezing or rales.  Musculoskeletal:     Right shoulder: Decreased range of motion. Decreased strength.     Left shoulder: Decreased range of motion. Decreased strength.     Right elbow: Normal  range of motion. Tenderness present in lateral epicondyle.     Left elbow: Normal range of motion. Tenderness present in lateral epicondyle.  Neurological:     Mental Status: He is alert.          Assessment & Plan:  Papilloma of skin of scalp  Medial epicondylitis of left elbow  Chronic pain of both shoulders  Medial epicondylitis of elbow, right I feel the patient is likely dealing with polyarthralgias in his shoulders and in his elbows and in his hands due to repetitive work over years.  He is not taking meloxicam.  I do not feel comfortable injecting multiple joints with cortisone repeatedly nor does the patient.  Therefore I recommended trying Celebrex 200 mg twice daily to try to mitigate the pain.  I do not feel that the pain has been a completely resolved as I believe that this is due to repetitive motion at work.  If the pain continues to worsen we can consult orthopedics.  I agreed to remove the papilloma of the scalp.  The patient does not want me to shave his head for a sterile closure so I recommended doing a shave biopsy of it and leaving it open.  He is comfortable with that.  I anesthetized the papilloma on his occiput with 0% lidocaine with epinephrine.  I removed the 7 mm fleshy nodule with a shave biopsy technique down to the underlying dermis.  I then achieved hemostasis with Drysol and used Neosporin as a Band-Aid.  Wound care was discussed.

## 2022-09-24 ENCOUNTER — Telehealth: Payer: Self-pay

## 2022-09-24 NOTE — Telephone Encounter (Signed)
Pt called and states he is still having pain at site where mole was removed from top of head. Pt states it is not swollen. Pt states it feels like "a root that radiates down the back of neck." Pt states he is having migraines. Does he need to be seen? Thank you!

## 2022-09-27 ENCOUNTER — Encounter: Payer: Medicaid Other | Admitting: Family Medicine

## 2022-09-27 NOTE — Progress Notes (Signed)
Subjective:    Patient ID: Thomas Gutierrez, male    DOB: 1966/02/02, 56 y.o.   MRN: [id number]  HPI 09/14/22 Patient presents today with a multitude of concerns.  I have previously injected his left lateral epicondyle for epicondylitis.  He continues to have some mild pain in that elbow.  He has now developed tenderness around the right lateral epicondyle.  He also has some numbness in his hands at night when he sleeps however it sounds like he may be developing carpal tunnel syndrome rather than cervical radiculopathy because I believe that the fifth finger is not involved.  The numbness also goes away when he shakes his hands and changes positions.  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  He also does Architect.  He also complains of pain in his right shoulder.  I have previously injected his right subacromial space due to tendinitis in his right shoulder.  He started to get an aching pain in his right shoulder and in his left shoulder with abduction.  He also has a trigger finger in the third and fourth fingers on his left hand.  His MCP joint will occasionally lock when flexed.  He also reports a mole on the back of his head that he wants removed.  In the center of his occiput, is a 7 mm fleshy skin tag nodule that has been there his entire life.  Is getting irritated every time he has a haircut and he would like me to remove it.  At that time, my plan was:  I feel the patient is likely dealing with polyarthralgias in his shoulders and in his elbows and in his hands due to repetitive work over years.  He is not taking meloxicam.  I do not feel comfortable injecting multiple joints with cortisone repeatedly nor does the patient.  Therefore I recommended trying Celebrex 200 mg twice daily to try to mitigate the pain.  I do not feel that the pain has been a completely resolved as I believe that this is due to  repetitive motion at work.  If the pain continues to worsen we can consult orthopedics.  I agreed to remove the papilloma of the scalp.  The patient does not want me to shave his head for a sterile closure so I recommended doing a shave biopsy of it and leaving it open.  He is comfortable with that.  I anesthetized the papilloma on his occiput with 0.1% lidocaine with epinephrine.  I removed the 7 mm fleshy nodule with a shave biopsy technique down to the underlying dermis.  I then achieved hemostasis with Drysol and used Neosporin as a Band-Aid.  Wound care was discussed.  09/27/22  Past Medical History:  Diagnosis Date   Vertigo    Past Surgical History:  Procedure Laterality Date   NASAL SEPTOPLASTY W/ TURBINOPLASTY Bilateral 07/21/2020   Procedure: NASAL SEPTOPLASTY WITH BILATERAL  TURBINATE REDUCTION;  Surgeon: Leta Baptist, MD;  Location: [facility];  Service: ENT;  Laterality: Bilateral;   Current Outpatient Medications on File Prior to Visit  Medication Sig Dispense Refill   celecoxib (CELEBREX) 200 MG capsule Take 1 capsule (200 mg total) by mouth 2 (two) times daily. 60 capsule 11   meloxicam (MOBIC) 15 MG tablet Take 1 tablet (15 mg total) by mouth daily. 30 tablet 0   sildenafil (VIAGRA) 100 MG tablet Take 0.5-1 tablets (  50-100 mg total) by mouth daily as needed for erectile dysfunction. 5 tablet 11   No current facility-administered medications on file prior to visit.   Allergies  Allergen Reactions   Haldol [Haloperidol Lactate] Other (See Comments)    REACTION: Muscle tightness (tension) all over body for 6 hours.    Social History   Socioeconomic History   Marital status: Single    Spouse name: Not on file   Number of children: 5   Years of education: Not on file   Highest education level: High school graduate  Occupational History    Comment: NA  Tobacco Use   Smoking status: Never   Smokeless tobacco: Current    Types: Snuff  Vaping Use   Vaping Use:  Never used  Substance and Sexual Activity   Alcohol use: No   Drug use: No   Sexual activity: Yes  Other Topics Concern   Not on file  Social History Narrative   Lives with sig other x 27 years   Caffeine- sweet tea all day   I hike the [employer], play basketball   Social Determinants of Health   Financial Resource Strain: Not on file  Food Insecurity: Not on file  Transportation Needs: Not on file  Physical Activity: Not on file  Stress: Not on file  Social Connections: Not on file  Intimate Partner Violence: Not on file      Review of Systems     Objective:   Physical Exam Vitals reviewed.  Constitutional:      Appearance: Normal appearance. He is normal weight.  Cardiovascular:     Rate and Rhythm: Normal rate and regular rhythm.     Heart sounds: Normal heart sounds. No murmur heard.    No friction rub. No gallop.  Pulmonary:     Effort: Pulmonary effort is normal. No respiratory distress.     Breath sounds: Normal breath sounds. No stridor. No wheezing or rales.  Musculoskeletal:     Right shoulder: Decreased range of motion. Decreased strength.     Left shoulder: Decreased range of motion. Decreased strength.     Right elbow: Normal range of motion. Tenderness present in lateral epicondyle.     Left elbow: Normal range of motion. Tenderness present in lateral epicondyle.  Neurological:     Mental Status: He is alert.          Assessment & Plan:

## 2023-06-01 ENCOUNTER — Telehealth: Payer: Self-pay

## 2023-06-01 MED ORDER — SILDENAFIL CITRATE 100 MG PO TABS: 50.0000 mg | ORAL_TABLET | Freq: Every day | ORAL | 3 refills | Status: AC | PRN

## 2023-06-01 NOTE — Telephone Encounter (Signed)
Pt states he is having issues with increased pain in shoulders and elbows. Pt asks if there is anyway the Celebrex can be increased to 3 times a day? Pt states he is having trouble sleeping because of the pain. Thank  you.

## 2023-06-03 ENCOUNTER — Other Ambulatory Visit: Payer: Self-pay

## 2023-06-03 DIAGNOSIS — G8929 Other chronic pain: Secondary | ICD-10-CM

## 2023-06-13 DIAGNOSIS — M25521 Pain in right elbow: Secondary | ICD-10-CM | POA: Insufficient documentation

## 2023-06-17 DIAGNOSIS — G5601 Carpal tunnel syndrome, right upper limb: Secondary | ICD-10-CM | POA: Insufficient documentation

## 2023-08-05 ENCOUNTER — Other Ambulatory Visit (INDEPENDENT_AMBULATORY_CARE_PROVIDER_SITE_OTHER): Payer: Self-pay | Admitting: Family Medicine

## 2023-08-12 DIAGNOSIS — M25511 Pain in right shoulder: Secondary | ICD-10-CM | POA: Diagnosis not present

## 2023-08-12 DIAGNOSIS — M7711 Lateral epicondylitis, right elbow: Secondary | ICD-10-CM | POA: Diagnosis not present

## 2023-08-12 DIAGNOSIS — M65332 Trigger finger, left middle finger: Secondary | ICD-10-CM | POA: Insufficient documentation

## 2023-08-12 DIAGNOSIS — G5601 Carpal tunnel syndrome, right upper limb: Secondary | ICD-10-CM | POA: Diagnosis not present

## 2023-08-12 DIAGNOSIS — M25512 Pain in left shoulder: Secondary | ICD-10-CM | POA: Diagnosis not present

## 2023-08-31 DIAGNOSIS — M25512 Pain in left shoulder: Secondary | ICD-10-CM | POA: Diagnosis not present

## 2023-08-31 DIAGNOSIS — M25511 Pain in right shoulder: Secondary | ICD-10-CM | POA: Diagnosis not present

## 2023-09-08 ENCOUNTER — Ambulatory Visit: Payer: 59 | Admitting: Family Medicine

## 2023-09-23 ENCOUNTER — Other Ambulatory Visit: Payer: Self-pay | Admitting: Family Medicine

## 2023-09-23 DIAGNOSIS — Z1211 Encounter for screening for malignant neoplasm of colon: Secondary | ICD-10-CM

## 2023-09-23 DIAGNOSIS — Z1212 Encounter for screening for malignant neoplasm of rectum: Secondary | ICD-10-CM

## 2023-10-13 ENCOUNTER — Ambulatory Visit (INDEPENDENT_AMBULATORY_CARE_PROVIDER_SITE_OTHER): Payer: 59 | Admitting: Family Medicine

## 2023-10-13 ENCOUNTER — Encounter: Payer: Self-pay | Admitting: Family Medicine

## 2023-10-13 VITALS — BP 142/86 | HR 94 | Temp 98.6°F | Ht 75.0 in | Wt 210.2 lb

## 2023-10-13 DIAGNOSIS — D485 Neoplasm of uncertain behavior of skin: Secondary | ICD-10-CM

## 2023-10-13 DIAGNOSIS — L989 Disorder of the skin and subcutaneous tissue, unspecified: Secondary | ICD-10-CM

## 2023-10-13 DIAGNOSIS — M546 Pain in thoracic spine: Secondary | ICD-10-CM | POA: Diagnosis not present

## 2023-10-13 MED ORDER — METHOCARBAMOL 750 MG PO TABS: 750.0000 mg | ORAL_TABLET | Freq: Four times a day (QID) | ORAL | 0 refills | Status: DC | PRN

## 2023-10-13 NOTE — Progress Notes (Signed)
Subjective:    Patient ID: Thomas Gutierrez, male    DOB: 07/05/1966, 57 y.o.   MRN: [id number]  Muscle Pain    Patient states that over the last 3 weeks he has developed a lesion on the tip of his nose.  It is roughly 3 to 4 mm in diameter.  It is a hyperkeratotic papule.  He states that it bleeds easily if he scratches it.  It is not going away.  He has a family history of melanoma in his son.  Therefore he is requesting to see a dermatologist.  He was pulling a hot water heater from the home that weighed approximately 600 pounds.  Afterward he developed pain underneath his left scapula.  He has full range of motion in his left shoulder without pain.  There is no tenderness to palpation along the spinous processes of the thoracic spine or the scapula itself.  He reports a tightness of the pain underneath the shoulder blade that comes and goes in spasms. Past Medical History:  Diagnosis Date   Vertigo    Past Surgical History:  Procedure Laterality Date   NASAL SEPTOPLASTY W/ TURBINOPLASTY Bilateral 07/21/2020   Procedure: NASAL SEPTOPLASTY WITH BILATERAL  TURBINATE REDUCTION;  Surgeon: Newman Pies, MD;  Location: [facility];  Service: ENT;  Laterality: Bilateral;   Current Outpatient Medications on File Prior to Visit  Medication Sig Dispense Refill   celecoxib (CELEBREX) 200 MG capsule Take 1 capsule (200 mg total) by mouth 2 (two) times daily. 60 capsule 11   meloxicam (MOBIC) 15 MG tablet Take 1 tablet (15 mg total) by mouth daily. 30 tablet 0   sildenafil (VIAGRA) 100 MG tablet Take 0.5-1 tablets (50-100 mg total) by mouth daily as needed for erectile dysfunction. 5 tablet 3   No current facility-administered medications on file prior to visit.   Allergies  Allergen Reactions   Haldol [Haloperidol Lactate] Other (See Comments)    REACTION: Muscle tightness (tension) all over body for 6 hours.    Social History   Socioeconomic History   Marital status: Single     Spouse name: Not on file   Number of children: 5   Years of education: Not on file   Highest education level: High school graduate  Occupational History    Comment: NA  Tobacco Use   Smoking status: Never   Smokeless tobacco: Current    Types: Snuff  Vaping Use   Vaping status: Never Used  Substance and Sexual Activity   Alcohol use: No   Drug use: No   Sexual activity: Yes  Other Topics Concern   Not on file  Social History Narrative   Lives with sig other x 27 years   Caffeine- sweet tea all day   I hike the [employer], play basketball   Social Determinants of Health   Financial Resource Strain: Not on file  Food Insecurity: Not on file  Transportation Needs: Not on file  Physical Activity: Not on file  Stress: Not on file  Social Connections: Not on file  Intimate Partner Violence: Not on file      Review of Systems     Objective:   Physical Exam Vitals reviewed.  Constitutional:      Appearance: Normal appearance. He is normal weight.  Cardiovascular:     Rate and Rhythm: Normal rate and regular rhythm.     Heart sounds: Normal heart sounds. No murmur heard.    No friction rub.  No gallop.  Pulmonary:     Effort: Pulmonary effort is normal. No respiratory distress.     Breath sounds: Normal breath sounds. No stridor. No wheezing or rales.  Musculoskeletal:     Right shoulder: No bony tenderness.     Left shoulder: No bony tenderness.     Right elbow: Normal range of motion. Tenderness present in lateral epicondyle.     Left elbow: Normal range of motion. Tenderness present in lateral epicondyle.       Arms:     Thoracic back: Spasms and tenderness present.  Neurological:     Mental Status: He is alert.          Assessment & Plan:  Non-healing skin lesion of nose I offered the patient liquid nitrogen cryotherapy versus a skin biopsy today versus referral to dermatology.  We discussed the pros and cons and he elects to try a liquid nitrogen  cryotherapy.  I froze the lesion for a total of 30 seconds.  Patient tolerated the procedure without complication.  If the lesion persist after cryotherapy I would recommend dermatology consultation for biopsy.  Differential diagnosis includes an irritated wart versus squamous cell carcinoma.  I believe the pain in the middle of his back is a pulled muscle.  I recommended Robaxin-750 milligrams every 6 hours as needed.  I anticipate pain will gradually improve over the next week.

## 2023-11-08 DIAGNOSIS — M7711 Lateral epicondylitis, right elbow: Secondary | ICD-10-CM | POA: Diagnosis not present

## 2023-11-08 DIAGNOSIS — G5601 Carpal tunnel syndrome, right upper limb: Secondary | ICD-10-CM | POA: Diagnosis not present

## 2024-08-01 ENCOUNTER — Ambulatory Visit: Admitting: Family Medicine

## 2024-08-01 ENCOUNTER — Encounter: Payer: Self-pay | Admitting: Family Medicine

## 2024-08-01 ENCOUNTER — Ambulatory Visit: Payer: Self-pay | Admitting: *Deleted

## 2024-08-01 VITALS — BP 140/70 | HR 74 | Temp 98.2°F | Ht 75.0 in | Wt 209.1 lb

## 2024-08-01 DIAGNOSIS — M25561 Pain in right knee: Secondary | ICD-10-CM | POA: Diagnosis not present

## 2024-08-01 MED ORDER — HYDROCODONE-ACETAMINOPHEN 5-325 MG PO TABS: 1.0000 | ORAL_TABLET | Freq: Four times a day (QID) | ORAL | 0 refills | Status: AC | PRN

## 2024-08-01 MED ORDER — CELECOXIB 100 MG PO CAPS: 100.0000 mg | ORAL_CAPSULE | Freq: Two times a day (BID) | ORAL | 0 refills | Status: DC

## 2024-08-01 MED ORDER — TRIAMCINOLONE ACETONIDE 40 MG/ML IJ SUSP
80.0000 mg | Freq: Once | INTRAMUSCULAR | Status: AC
  Administered 2024-08-01 (×2): 80 mg via INTRA_ARTICULAR

## 2024-08-01 NOTE — Progress Notes (Signed)
 Patient Office Visit  Assessment & Plan:  Acute pain of right knee -     Celecoxib ; Take 1 capsule (100 mg total) by mouth 2 (two) times daily.  Dispense: 60 capsule; Refill: 0 -     DG Knee 1-2 Views Right; Future -     HYDROcodone -Acetaminophen ; Take 1 tablet by mouth every 6 (six) hours as needed for up to 10 days for moderate pain (pain score 4-6).  Dispense: 30 tablet; Refill: 0 -     Triamcinolone  Acetonide   Assessment and Plan    Right knee pain Acute pain with swelling, likely ligament injury or arthritis. Popping sound noted. X-ray required to exclude fracture. - Order x-ray of right knee. - Prescribe Celebrex  for inflammation. - Prescribe hydrocodone  for pain, as needed, especially at night. - Administer cortisone injection in affected area.       Return if symptoms worsen or fail to improve.   Subjective:    Patient ID: Thomas Gutierrez, male    DOB: 05-Jun-1966  Age: 58 y.o. MRN: [id number]  Chief Complaint  Patient presents with   Knee Pain    R knee pain x 1 week. NKI.    HPI Discussed the use of AI scribe software for clinical note transcription with the patient, who gave verbal consent to proceed.  History of Present Illness        Thomas Gutierrez is a 58 year old male who presents with right knee pain.  He has been experiencing right knee pain for the past week, which began after hearing a 'pop' while bending down to work on a car. The pain is severe, rated at 7 or 8 out of 10, especially when moving the knee, bending it fully, or moving it side to side. The pain is localized between the knee and kneecap. He has difficulty sleeping due to the pain and has been limping as a result.  He has not taken any medications for the pain, as he generally avoids taking medicine. Despite playing ten years of football, he has no history of knee problems. No history of arthritis or ulcers. He has not tried over-the-counter pain relievers like Tylenol  or Advil. He has  previously taken Celebrex , which is covered by his insurance, and has tolerated hydrocodone  without adverse effects.  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 He is self-employed and cannot retire at this time. He lives in [place] and uses the [employer]. Physical Exam MUSCULOSKELETAL: Right knee swollen with skin worn down Results Assessment & Plan Right knee pain Acute pain with swelling, likely ligament injury or arthritis. Popping sound noted. X-ray required to exclude fracture. - Order x-ray of right knee. - Prescribe Celebrex  for inflammation. - Prescribe hydrocodone  for pain, as needed, especially at night. - Administer cortisone injection in affected area.    The ASCVD Risk score (Arnett DK, et al., 2019) failed to calculate for the following reasons:   Risk score cannot be calculated because patient has a medical history suggesting prior/existing ASCVD  Past Medical History:  Diagnosis Date   Vertigo    Past Surgical History:  Procedure Laterality Date   NASAL SEPTOPLASTY W/ TURBINOPLASTY Bilateral 07/21/2020   Procedure: NASAL SEPTOPLASTY WITH BILATERAL  TURBINATE REDUCTION;  Surgeon: Karis Clunes, MD;  Location: [facility];  Service: ENT;  Laterality: Bilateral;   Social History   Tobacco Use   Smoking status: Never   Smokeless tobacco: Current    Types: Snuff  Vaping Use   Vaping status: Never Used  Substance Use Topics   Alcohol use: No   Drug use: No   Family History  Problem Relation Age of Onset   Cancer Mother        breast   Cancer Father 33       lung   Cancer Sister        breast   Allergies  Allergen Reactions   Haldol [Haloperidol Lactate] Other (See Comments)    REACTION: Muscle tightness (tension) all over body for 6 hours.      ROS    Objective:    BP (!) 140/70   Pulse 74   Temp 98.2 F (36.8 C)   Ht 6' 3 (1.905 m)   Wt 209 lb 2 oz (94.9 kg)   SpO2 98%   BMI 26.14 kg/m  BP Readings from Last 3 Encounters:  08/01/24 (!) 140/70  10/13/23 (!) 142/86  09/14/22 128/82   Wt Readings from Last 3 Encounters:  08/01/24 209 lb 2 oz (94.9 kg)  10/13/23 210 lb 3.2 oz (95.3 kg)  09/14/22 215 lb 3.2 oz (97.6 kg)    Physical Exam Vitals and nursing note reviewed.  Constitutional:      General: He is not in acute distress.    Appearance: Normal appearance.     Comments: Patient is limping but is able to get on exam table.   HENT:     Head: Normocephalic.  Musculoskeletal:     Right knee: Swelling, effusion and bony tenderness present. Decreased range of motion. Tenderness present over the medial joint line. No lateral joint line or patellar tendon tenderness.     Left knee: No swelling or effusion.     Right lower leg: No edema.     Left lower leg: No edema.     Comments: Right knee- knee prepped and under sterile conditions- Depomedrol 80mg  with lidocaine  given in anserine bursa/medial aspect. Patient tolerated the procedure without complications.    Skin:    Comments: Right knee- thickened skin over knee cap.   Neurological:     General: No focal deficit present.     Mental Status: He is alert and oriented to person, place, and time.      No results found for any visits on 08/01/24.

## 2024-08-01 NOTE — Telephone Encounter (Signed)
 FYI Only or Action Required?: FYI only for provider.  Patient was last seen in primary care on 10/13/2023 by Duanne Butler DASEN, MD.  Called Nurse Triage reporting Knee Pain.  Symptoms began a week ago.  Interventions attempted: Nothing.  Symptoms are: gradually worsening.  Triage Disposition: See PCP When Office is Open (Within 3 Days)  Patient/caregiver understands and will follow disposition?: Yes   Reason for Disposition  MILD or MODERATE swelling (e.g., can't move joint normally, can't do usual activities) (Exceptions: Itchy, localized swelling; swelling is chronic.)  Answer Assessment - Initial Assessment Questions 1. LOCATION: Where is the swelling located?  (e.g., left, right, both knees)     R knee- shin and knee 2. ONSET: When did the swelling start? Does it come and go, or is it there all the time?     Last week- Monday 3. SWELLING: How bad is the swelling? Or, How large is it? (e.g., mild, moderate, severe; size of localized swelling)      Mild- top part of knee- sides 4. PAIN: Is there any pain? If Yes, ask: How bad is it? (Scale 0-10; or none, mild, moderate, severe)     Hurts with movement 5. SETTING: Has there been any recent work, exercise or other activity that involved that part of the body?      Patient job is floor- twisted as stood up 6. AGGRAVATING FACTORS: What makes the knee swelling worse? (e.g., walking, climbing stairs, running)     Activity- slight pain when sitting 7. ASSOCIATED SYMPTOMS: Is there any pain or redness?     Just pain- redness due to callous  8. OTHER SYMPTOMS: Do you have any other symptoms? (e.g., calf pain, chest pain, difficulty breathing, fever)     no  Protocols used: Knee Swelling-A-AH   Copied from CRM #[id number]. Topic: Clinical - Red Word Triage >> Aug 01, 2024  8:43 AM Charlet HERO wrote: Red Word that prompted transfer to Nurse Triage: Patient is stating that something popped in his knee and it is  hurting and swollen on his right knee.

## 2024-08-02 ENCOUNTER — Ambulatory Visit (HOSPITAL_COMMUNITY)
Admission: RE | Admit: 2024-08-02 | Discharge: 2024-08-02 | Disposition: A | Discharge status: Home / Self Care | Source: Ambulatory Visit | Attending: Family Medicine | Admitting: Family Medicine

## 2024-08-02 ENCOUNTER — Telehealth: Payer: Self-pay

## 2024-08-02 DIAGNOSIS — M25561 Pain in right knee: Secondary | ICD-10-CM | POA: Insufficient documentation

## 2024-08-02 NOTE — Telephone Encounter (Signed)
 Copied from CRM #[id number]. Topic: Clinical - Lab/Test Results >> Aug 02, 2024  4:43 PM Kevelyn M wrote: Reason for CRM: Patient calling for x-ray results

## 2024-08-03 ENCOUNTER — Encounter: Payer: Self-pay | Admitting: Family Medicine

## 2024-08-03 ENCOUNTER — Ambulatory Visit: Payer: Self-pay | Admitting: Family Medicine

## 2024-08-08 ENCOUNTER — Telehealth: Payer: Self-pay

## 2024-08-08 NOTE — Telephone Encounter (Signed)
 Copied from CRM #[id number]. Topic: Clinical - Lab/Test Results >> Aug 08, 2024  3:13 PM Sophia H wrote: Reason for CRM: Patient is following up on imaging on his knee. Called [facility] - nurse with patient. Please reach out when you get a chance! [phone]. TY! Patient very frustrated.

## 2024-08-15 ENCOUNTER — Ambulatory Visit: Payer: Self-pay

## 2024-08-15 NOTE — Telephone Encounter (Signed)
 FYI Only or Action Required?: FYI only for provider.  Patient was last seen in primary care on 08/01/2024 by Aletha Bene, MD.  Called Nurse Triage reporting Knee Pain.  Symptoms began a week ago.  Interventions attempted: Other: injection in office last.  Symptoms are: gradually worsening.  Triage Disposition: See PCP When Office is Open (Within 3 Days)  Patient/caregiver understands and will follow disposition?: Yes  Copied from CRM [id number]. Topic: Clinical - Red Word Triage >> Aug 15, 2024  4:16 PM Fonda T wrote: [facility] that prompted transfer to Nurse Triage:  Received call from patient, requesting an appointment as soon as possible.  Patien reports knee popped today, and has since been having knee pain, and difficulty walking. Reason for Disposition  [1] MODERATE pain (e.g., interferes with normal activities, limping) AND [2] present > 3 days  Answer Assessment - Initial Assessment Questions 1. LOCATION and RADIATION: Where is the pain located?      Right knee, felt a pop while working this morning 2. QUALITY: What does the pain feel like?  (e.g., sharp, dull, aching, burning)     Sharp pain 3. SEVERITY: How bad is the pain? What does it keep you from doing?   (Scale 1-10; or mild, moderate, severe)     7/10 4. ONSET: When did the pain start? Does it come and go, or is it there all the time?     One week ago, felt a pop and then felt another pop today 5. RECURRENT: Have you had this pain before? If Yes, ask: When, and what happened then?     One week ago 6. SETTING: Has there been any recent work, exercise or other activity that involved that part of the body?      denies 7. AGGRAVATING FACTORS: What makes the knee pain worse? (e.g., walking, climbing stairs, running)     Weight bearing and lateral movement of knee 8. ASSOCIATED SYMPTOMS: Is there any swelling or redness of the knee?     Not sure, he has not had the chance to look at it as he  has been working today 9. OTHER SYMPTOMS: Do you have any other symptoms? (e.g., calf pain, chest pain, difficulty breathing, fever)     denies 10. PREGNANCY: Is there any chance you are pregnant? When was your last menstrual period?       N/a  Protocols used: Knee Pain-A-AH

## 2024-08-16 ENCOUNTER — Ambulatory Visit: Admitting: Family Medicine

## 2024-08-16 ENCOUNTER — Encounter: Payer: Self-pay | Admitting: Family Medicine

## 2024-08-16 VITALS — BP 130/82 | HR 80 | Temp 97.9°F | Ht 75.0 in | Wt 208.4 lb

## 2024-08-16 DIAGNOSIS — M25561 Pain in right knee: Secondary | ICD-10-CM

## 2024-08-16 NOTE — Progress Notes (Signed)
 Subjective:    Patient ID: Thomas Gutierrez, male    DOB: 1966-02-08, 58 y.o.   MRN: [id number]  Knee Pain    Patient complains of pain in his right knee.  The pain is located around and under the patella and also over the medial compartment.  It hurts to bend and squat.  It hurts to kneel.  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  He has been doing that recently as well.  X-ray showed mild prepatellar bursitis as well as some mild patellofemoral arthritis but no obvious significant abnormality.  The patient had a cortisone injection that was unhelpful.  He is also been taking Celebrex  with minimal relief. Past Medical History:  Diagnosis Date   Vertigo    Past Surgical History:  Procedure Laterality Date   NASAL SEPTOPLASTY W/ TURBINOPLASTY Bilateral 07/21/2020   Procedure: NASAL SEPTOPLASTY WITH BILATERAL  TURBINATE REDUCTION;  Surgeon: Karis Clunes, MD;  Location: [facility];  Service: ENT;  Laterality: Bilateral;   Current Outpatient Medications on File Prior to Visit  Medication Sig Dispense Refill   celecoxib  (CELEBREX ) 100 MG capsule Take 1 capsule (100 mg total) by mouth 2 (two) times daily. 60 capsule 0   celecoxib  (CELEBREX ) 200 MG capsule Take 1 capsule (200 mg total) by mouth 2 (two) times daily. (Patient not taking: Reported on 08/01/2024) 60 capsule 11   meloxicam  (MOBIC ) 15 MG tablet Take 1 tablet (15 mg total) by mouth daily. (Patient not taking: Reported on 08/01/2024) 30 tablet 0   methocarbamol  (ROBAXIN ) 750 MG tablet Take 1 tablet (750 mg total) by mouth every 6 (six) hours as needed for muscle spasms. (Patient not taking: Reported on 08/01/2024) 30 tablet 0   sildenafil  (VIAGRA ) 100 MG tablet Take 0.5-1 tablets (50-100 mg total) by mouth daily as needed for erectile dysfunction. (Patient not taking: Reported on 08/01/2024) 5 tablet 3   No current facility-administered medications on file prior to visit.   Allergies   Allergen Reactions   Haldol [Haloperidol Lactate] Other (See Comments)    REACTION: Muscle tightness (tension) all over body for 6 hours.    Social History   Socioeconomic History   Marital status: Single    Spouse name: Not on file   Number of children: 5   Years of education: Not on file   Highest education level: High school graduate  Occupational History    Comment: NA  Tobacco Use   Smoking status: Never   Smokeless tobacco: Current    Types: Snuff  Vaping Use   Vaping status: Never Used  Substance and Sexual Activity   Alcohol use: No   Drug use: No   Sexual activity: Yes  Other Topics Concern   Not on file  Social History Narrative   Lives with sig other x 27 years   Caffeine- sweet tea all day   I hike the [employer], play basketball   Social Drivers of Health   Financial Resource Strain: Not on file  Food Insecurity: Not on file  Transportation Needs: Not on file  Physical Activity: Not on file  Stress: Not on file  Social Connections: Not on file  Intimate Partner Violence: Not on file      Review of Systems     Objective:   Physical Exam Vitals reviewed.  Constitutional:      Appearance: Normal appearance. He is normal weight.  Cardiovascular:     Rate and  Rhythm: Normal rate and regular rhythm.     Heart sounds: Normal heart sounds. No murmur heard.    No friction rub. No gallop.  Pulmonary:     Effort: Pulmonary effort is normal. No respiratory distress.     Breath sounds: Normal breath sounds. No stridor. No wheezing or rales.  Musculoskeletal:     Right knee: No effusion or erythema. Normal range of motion. Tenderness present over the medial joint line and patellar tendon.  Neurological:     Mental Status: He is alert.          Assessment & Plan:  Right medial knee pain I believe this is likely an overuse injury due to his work.  I recommended activity modifications for the next 2 to [redacted] weeks along with his NSAID.  Do not  squat or work on his knees.  Instead try to work at a standing height.  If the knee pain is improving with activity modification, no further workup is necessary.  If the knee pain is worsening I would recommend an MRI to rule out medial meniscal tear

## 2024-08-31 ENCOUNTER — Telehealth: Payer: Self-pay

## 2024-08-31 ENCOUNTER — Ambulatory Visit: Payer: Self-pay

## 2024-08-31 NOTE — Telephone Encounter (Signed)
 FYI Only or Action Required?: FYI only for provider.  Patient was last seen in primary care on 08/16/2024 by Duanne Butler DASEN, MD.  Called Nurse Triage reporting Knee Pain.  Symptoms began about a month ago.  Interventions attempted: OTC medications: Ibuprofen and Rest, hydration, or home remedies.  Symptoms are: gradually worsening.  Triage Disposition: Go to ED Now (or PCP Triage)  Patient/caregiver understands and will follow disposition?: Unsure  Reason for Disposition  Patient sounds very sick or weak to the triager  Answer Assessment - Initial Assessment Questions Patient states he's about ready to cry the pain hurts so bad, I advised patient to ED if pain is that unbearable.   1. LOCATION and RADIATION: Where is the pain located?      States it feels like a ligament tear in right knee  2. QUALITY: What does the pain feel like?  (e.g., sharp, dull, aching, burning)     Sharp, all with movement  3. SEVERITY: How bad is the pain? What does it keep you from doing?   (Scale 1-10; or mild, moderate, severe)     10/10, can barely walk, hurts to push gas pedal in car  4. ONSET: When did the pain start? Does it come and go, or is it there all the time?     Month ago  Protocols used: Knee Pain-A-AH  Copied from CRM [id number]. Topic: Clinical - Red Word Triage >> Aug 31, 2024 10:38 AM Thomas Gutierrez wrote: Red Word that prompted transfer to Nurse Triage: knee is getting worse. Schedule an MRI   ligament damage. not getting better. was told would need MRI. >> Aug 31, 2024  1:01 PM Thomas Gutierrez wrote: Pt stated his knee is still painful.

## 2024-08-31 NOTE — Telephone Encounter (Signed)
 FYI Only or Action Required?: Action required by provider: update on patient condition.  Patient was last seen in primary care on 08/16/2024 by Thomas Gutierrez DASEN, MD.  Called Nurse Triage reporting Information Only .  Symptoms began several days ago.  Interventions attempted: Rest, hydration, or home remedies.  Symptoms are: unchanged.  Triage Disposition: Call PCP When Office is Open  Patient/caregiver understands and will follow disposition?: Yes  **See note below**      Copied from CRM #[id number]. Topic: Clinical - Red Word Triage >> Aug 31, 2024 10:38 AM Ivette P wrote: Red Word that prompted transfer to Nurse Triage: knee is getting worse. Schedule an MRI   ligament damage. not getting better. was told would need MRI. Reason for Disposition  [1] Caller requesting NON-URGENT health information AND [2] PCP's office is the best resource  Answer Assessment - Initial Assessment Questions 1. REASON FOR CALL: What is the main reason for your call? or How can I best help you?   Knee pain is getting worse, and was told by Dr. Duanne during his 8/28 visit that he would need an MRI. He would like to get that scheduled as soon as possible  Protocols used: Information Only Call - No Triage-A-AH

## 2024-08-31 NOTE — Telephone Encounter (Signed)
 Copied from CRM #[id number]. Topic: Clinical - Medical Advice >> Aug 31, 2024  1:22 PM DeAngela L wrote: Reason for CRM: patient is calling to speak with Dr Duanne he is having a lot of knee pain and state she was told by Dr Duanne to call back if he doesn't feel better   Pt num [phone] (M)

## 2024-09-03 ENCOUNTER — Encounter: Payer: Self-pay | Admitting: Family Medicine

## 2024-09-03 ENCOUNTER — Telehealth: Payer: Self-pay

## 2024-09-03 ENCOUNTER — Other Ambulatory Visit: Payer: Self-pay | Admitting: Family Medicine

## 2024-09-03 ENCOUNTER — Ambulatory Visit: Admitting: Family Medicine

## 2024-09-03 VITALS — BP 132/76 | HR 88 | Temp 97.9°F | Ht 75.0 in | Wt 208.0 lb

## 2024-09-03 DIAGNOSIS — S83241D Other tear of medial meniscus, current injury, right knee, subsequent encounter: Secondary | ICD-10-CM

## 2024-09-03 DIAGNOSIS — S83241A Other tear of medial meniscus, current injury, right knee, initial encounter: Secondary | ICD-10-CM

## 2024-09-03 MED ORDER — HYDROCODONE-ACETAMINOPHEN 5-325 MG PO TABS: 1.0000 | ORAL_TABLET | Freq: Four times a day (QID) | ORAL | 0 refills | Status: AC | PRN

## 2024-09-03 MED ORDER — PREDNISONE 20 MG PO TABS: ORAL_TABLET | ORAL | 0 refills | Status: AC

## 2024-09-03 NOTE — Progress Notes (Signed)
 Subjective:    Patient ID: Thomas Gutierrez, male    DOB: 1966/04/11, 58 y.o.   MRN: [id number]  Knee Pain   08/16/24 Patient complains of pain in his right knee.  The pain is located around and under the patella and also over the medial compartment.  It hurts to bend and squat.  It hurts to kneel.  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  He has been doing that recently as well.  X-ray showed mild prepatellar bursitis as well as some mild patellofemoral arthritis but no obvious significant abnormality.  The patient had a cortisone injection that was unhelpful.  He is also been taking Celebrex  with minimal relief.  At that time, my plan was: I believe this is likely an overuse injury due to his work.  I recommended activity modifications for the next 2 to [redacted] weeks along with his NSAID.  Do not squat or work on his knees.  Instead try to work at a standing height.  If the knee pain is improving with activity modification, no further workup is necessary.  If the knee pain is worsening I would recommend an MRI to rule out medial meniscal tear  09/03/24 Patient presents today complaining of severe pain over his medial right knee.  It hurts from the simply to touch that portion of his superior tibia.  There is no tenderness over the pes anserine bursa.  The patient is able to internally rotate his hip and put his leg on top of his left knee without any pain.  He is able to fully extend the right knee without any pain.  However he is having a hard time bearing any weight on his right leg due to the severe pain over the medial portion of his knee.  There is no erythema.  There is no warmth. Past Medical History:  Diagnosis Date   Vertigo    Past Surgical History:  Procedure Laterality Date   NASAL SEPTOPLASTY W/ TURBINOPLASTY Bilateral 07/21/2020   Procedure: NASAL SEPTOPLASTY WITH BILATERAL  TURBINATE REDUCTION;  Surgeon: Karis Clunes, MD;  [facility];  Service: ENT;  Laterality: Bilateral;   Current Outpatient Medications on File Prior to Visit  Medication Sig Dispense Refill   celecoxib  (CELEBREX ) 100 MG capsule Take 1 capsule (100 mg total) by mouth 2 (two) times daily. 60 capsule 0   celecoxib  (CELEBREX ) 200 MG capsule Take 1 capsule (200 mg total) by mouth 2 (two) times daily. (Patient not taking: Reported on 08/01/2024) 60 capsule 11   meloxicam  (MOBIC ) 15 MG tablet Take 1 tablet (15 mg total) by mouth daily. (Patient not taking: Reported on 08/01/2024) 30 tablet 0   methocarbamol  (ROBAXIN ) 750 MG tablet Take 1 tablet (750 mg total) by mouth every 6 (six) hours as needed for muscle spasms. (Patient not taking: Reported on 08/01/2024) 30 tablet 0   sildenafil  (VIAGRA ) 100 MG tablet Take 0.5-1 tablets (50-100 mg total) by mouth daily as needed for erectile dysfunction. (Patient not taking: Reported on 08/01/2024) 5 tablet 3   No current facility-administered medications on file prior to visit.   Allergies  Allergen Reactions   Haldol [Haloperidol Lactate] Other (See Comments)    REACTION: Muscle tightness (tension) all over body for 6 hours.    Social History   Socioeconomic History   Marital status: Single    Spouse name: Not on file   Number of children: 5   Years  of education: Not on file   Highest education level: High school graduate  Occupational History    Comment: NA  Tobacco Use   Smoking status: Never   Smokeless tobacco: Current    Types: Snuff  Vaping Use   Vaping status: Never Used  Substance and Sexual Activity   Alcohol use: No   Drug use: No   Sexual activity: Yes  Other Topics Concern   Not on file  Social History Narrative   Lives with sig other x 27 years   Caffeine- sweet tea all day   I hike the [employer], play basketball   Social Drivers of Health   Financial Resource Strain: Not on file  Food Insecurity: Not on file  Transportation Needs: Not on file  Physical Activity: Not on  file  Stress: Not on file  Social Connections: Not on file  Intimate Partner Violence: Not on file      Review of Systems     Objective:   Physical Exam Vitals reviewed.  Constitutional:      Appearance: Normal appearance. He is normal weight.  Cardiovascular:     Rate and Rhythm: Normal rate and regular rhythm.     Heart sounds: Normal heart sounds. No murmur heard.    No friction rub. No gallop.  Pulmonary:     Effort: Pulmonary effort is normal. No respiratory distress.     Breath sounds: Normal breath sounds. No stridor. No wheezing or rales.  Musculoskeletal:     Right knee: No effusion or erythema. Normal range of motion. Tenderness present over the medial joint line and patellar tendon.  Neurological:     Mental Status: He is alert.          Assessment & Plan:  Tear of medial meniscus of right knee, current, unspecified tear type, initial encounter I suspect the patient has torn his right medial meniscus.  Has already had a cortisone injection.  X-ray was unremarkable.  Proceed with MRI of the knee.  Temporarily use prednisone  taper pack.  I gave the patient some hydrocodone  for pain.

## 2024-09-03 NOTE — Telephone Encounter (Signed)
 Pt has appointment today at 3. Mjp,lpn  Copied from CRM #[id number]. Topic: Clinical - Medical Advice >> Sep 03, 2024 11:53 AM Leonette SQUIBB wrote: Reason for CRM: pt returned call from the nurse.  I tired too transfer but was put on hold for over 5 minutes.  Told patient I would leave a message that he returned all.

## 2024-09-05 ENCOUNTER — Other Ambulatory Visit (HOSPITAL_COMMUNITY): Payer: Self-pay

## 2024-09-10 ENCOUNTER — Encounter: Payer: Self-pay | Admitting: Family Medicine

## 2024-09-11 ENCOUNTER — Telehealth: Payer: Self-pay

## 2024-09-11 NOTE — Telephone Encounter (Signed)
 Copied from CRM #[id number]. Topic: Clinical - Medical Advice >> Sep 11, 2024  1:56 PM Tiffini S wrote: Reason for CRM: Levon with [facility] [phone] is requesting an update for MRI- need authorization

## 2024-09-14 ENCOUNTER — Telehealth: Payer: Self-pay

## 2024-09-14 NOTE — Telephone Encounter (Signed)
 Spoke with Bari with [facility] for peer to peer for patient's MRI. Additional information was requested by insurance company to approve. Additional information provided and MRI was approved with approval number [id number]. Pt advised. Mjp,lpn

## 2024-09-21 ENCOUNTER — Ambulatory Visit (HOSPITAL_COMMUNITY)
Admission: RE | Admit: 2024-09-21 | Discharge: 2024-09-21 | Disposition: A | Discharge status: Home / Self Care | Source: Ambulatory Visit | Attending: Family Medicine | Admitting: Family Medicine

## 2024-09-21 DIAGNOSIS — S83241A Other tear of medial meniscus, current injury, right knee, initial encounter: Secondary | ICD-10-CM | POA: Diagnosis present

## 2024-09-24 ENCOUNTER — Ambulatory Visit: Payer: Self-pay | Admitting: Family Medicine

## 2024-09-24 DIAGNOSIS — M7702 Medial epicondylitis, left elbow: Secondary | ICD-10-CM

## 2024-09-24 DIAGNOSIS — R200 Anesthesia of skin: Secondary | ICD-10-CM

## 2024-10-03 ENCOUNTER — Ambulatory Visit (INDEPENDENT_AMBULATORY_CARE_PROVIDER_SITE_OTHER): Admitting: Orthopaedic Surgery

## 2024-10-03 ENCOUNTER — Ambulatory Visit (HOSPITAL_BASED_OUTPATIENT_CLINIC_OR_DEPARTMENT_OTHER): Payer: Self-pay | Admitting: Orthopaedic Surgery

## 2024-10-03 ENCOUNTER — Ambulatory Visit (HOSPITAL_BASED_OUTPATIENT_CLINIC_OR_DEPARTMENT_OTHER)

## 2024-10-03 DIAGNOSIS — G8929 Other chronic pain: Secondary | ICD-10-CM

## 2024-10-03 DIAGNOSIS — M25561 Pain in right knee: Secondary | ICD-10-CM

## 2024-10-03 NOTE — Progress Notes (Signed)
 Chief Complaint: Right knee pain     History of Present Illness:    Thomas Gutierrez is a 58 y.o. male presents with right knee pain for approximately last 2 months after he was working on a project where he was putting vinyl flooring down.  Since that time he has had medial based joint pain.  He has been having swelling.  This has been giving him a limp and feels like he does have a marble in the knee.  It is painful when going down steps.  He is here today for further discussion he has trialed anti-inflammatories as well    PMH/PSH/Family History/Social History/Meds/Allergies:    Past Medical History:  Diagnosis Date   Vertigo    Past Surgical History:  Procedure Laterality Date   NASAL SEPTOPLASTY W/ TURBINOPLASTY Bilateral 07/21/2020   Procedure: NASAL SEPTOPLASTY WITH BILATERAL  TURBINATE REDUCTION;  Surgeon: Karis Clunes, MD;  Location: [facility];  Service: ENT;  Laterality: Bilateral;   Social History   Socioeconomic History   Marital status: Single    Spouse name: Not on file   Number of children: 5   Years of education: Not on file   Highest education level: High school graduate  Occupational History    Comment: NA  Tobacco Use   Smoking status: Never   Smokeless tobacco: Current    Types: Snuff  Vaping Use   Vaping status: Never Used  Substance and Sexual Activity   Alcohol use: No   Drug use: No   Sexual activity: Yes  Other Topics Concern   Not on file  Social History Narrative   Lives with sig other x 27 years   Caffeine- sweet tea all day   I hike the [employer], play basketball   Social Drivers of Health   Financial Resource Strain: Not on file  Food Insecurity: Not on file  Transportation Needs: Not on file  Physical Activity: Not on file  Stress: Not on file  Social Connections: Not on file   Family History  Problem Relation Age of Onset   Cancer Mother        breast   Cancer Father 16       lung   Cancer Sister         breast   Allergies  Allergen Reactions   Haldol [Haloperidol Lactate] Other (See Comments)    REACTION: Muscle tightness (tension) all over body for 6 hours.    Current Outpatient Medications  Medication Sig Dispense Refill   celecoxib  (CELEBREX ) 100 MG capsule Take 1 capsule (100 mg total) by mouth 2 (two) times daily. 60 capsule 0   celecoxib  (CELEBREX ) 200 MG capsule Take 1 capsule (200 mg total) by mouth 2 (two) times daily. (Patient not taking: Reported on 08/01/2024) 60 capsule 11   HYDROcodone -acetaminophen  (NORCO/VICODIN) 5-325 MG tablet Take 1 tablet by mouth every 6 (six) hours as needed. 30 tablet 0   meloxicam  (MOBIC ) 15 MG tablet Take 1 tablet (15 mg total) by mouth daily. (Patient not taking: Reported on 08/01/2024) 30 tablet 0   methocarbamol  (ROBAXIN ) 750 MG tablet Take 1 tablet (750 mg total) by mouth every 6 (six) hours as needed for muscle spasms. (Patient not taking: Reported on 08/01/2024) 30 tablet 0   predniSONE  (DELTASONE ) 20 MG tablet 3 tabs poqday 1-2, 2 tabs poqday 3-4, 1 tab poqday 5-6 12 tablet 0   sildenafil  (VIAGRA ) 100 MG tablet Take 0.5-1 tablets (50-100 mg total) by  mouth daily as needed for erectile dysfunction. (Patient not taking: Reported on 08/01/2024) 5 tablet 3   No current facility-administered medications for this visit.   No results found.  Review of Systems:   A ROS was performed including pertinent positives and negatives as documented in the HPI.  Physical Exam :   Constitutional: NAD and appears stated age Neurological: Alert and oriented Psych: Appropriate affect and cooperative There were no vitals taken for this visit.   Comprehensive Musculoskeletal Exam:    Right medial based knee pain with positive McMurray negative Lachman negative posterior drawer no varus or valgus laxity no effusion distal neurosensory exams intact   Imaging:   Xray (4 views right knee): Normal  MRI (right knee): With a medial femoral condyle bone bruise  as well as a medial meniscal undersurface tear   I personally reviewed and interpreted the radiographs.   Assessment and Plan:   58 y.o. male with a right medial meniscal tear and surrounding bony edema in the distal femur as a result.  At this time this has been quite symptomatic after an injury 2 months prior where he was putting down flooring.  He has been having giving way and popping as well as clicking consistent with mechanical symptoms.  Given this I did discuss treatment options.  I did discuss an injection which would likely be more temporary versus knee arthroscopy with meniscal debridement.  Overall I do believe he would benefit well from this.  After discussion he would like to proceed  -Plan for right knee arthroscopy with medial meniscal debridement   After a lengthy discussion of treatment options, including risks, benefits, alternatives, complications of surgical and nonsurgical conservative options, the patient elected surgical repair.   The patient  is aware of the material risks  and complications including, but not limited to injury to adjacent structures, neurovascular injury, infection, numbness, bleeding, implant failure, thermal burns, stiffness, persistent pain, failure to heal, disease transmission from allograft, need for further surgery, dislocation, anesthetic risks, blood clots, risks of death,and others. The probabilities of surgical success and failure discussed with patient given their particular co-morbidities.The time and nature of expected rehabilitation and recovery was discussed.The patient's questions were all answered preoperatively.  No barriers to understanding were noted. I explained the natural history of the disease process and Rx rationale.  I explained to the patient what I considered to be reasonable expectations given their personal situation.  The final treatment plan was arrived at through a shared patient decision making process model.    I  personally saw and evaluated the patient, and participated in the management and treatment plan.  Elspeth Parker, MD Attending Physician, Orthopedic Surgery  This document was dictated using Dragon voice recognition software. A reasonable attempt at proof reading has been made to minimize errors.

## 2024-10-22 ENCOUNTER — Encounter: Payer: Self-pay | Admitting: Radiology

## 2024-11-30 ENCOUNTER — Encounter (HOSPITAL_BASED_OUTPATIENT_CLINIC_OR_DEPARTMENT_OTHER): Admitting: Orthopaedic Surgery

## 2025-01-07 ENCOUNTER — Ambulatory Visit: Admitting: Family Medicine

## 2025-01-07 ENCOUNTER — Encounter: Payer: Self-pay | Admitting: Family Medicine

## 2025-01-07 ENCOUNTER — Ambulatory Visit: Payer: Self-pay

## 2025-01-07 VITALS — BP 136/100 | HR 96 | Ht 75.0 in | Wt 219.0 lb

## 2025-01-07 DIAGNOSIS — G8929 Other chronic pain: Secondary | ICD-10-CM

## 2025-01-07 DIAGNOSIS — M542 Cervicalgia: Secondary | ICD-10-CM

## 2025-01-07 MED ORDER — CELECOXIB 100 MG PO CAPS: 100.0000 mg | ORAL_CAPSULE | Freq: Two times a day (BID) | ORAL | 0 refills | Status: AC

## 2025-01-07 MED ORDER — METHOCARBAMOL 750 MG PO TABS: 750.0000 mg | ORAL_TABLET | Freq: Three times a day (TID) | ORAL | 1 refills | Status: AC | PRN

## 2025-01-07 NOTE — Progress Notes (Signed)
 "  Patient Office Visit  Assessment & Plan:  Neck pain, chronic -     Methocarbamol ; Take 1 tablet (750 mg total) by mouth every 8 (eight) hours as needed for muscle spasms.  Dispense: 30 tablet; Refill: 1 -     Celecoxib ; Take 1 capsule (100 mg total) by mouth 2 (two) times daily.  Dispense: 60 capsule; Refill: 0   Assessment and Plan    Cervical spondylosis with chronic neck pain Chronic neck pain likely due to cervical spondylosis, exacerbated recently. Severe pain affecting sleep and daily activities. Previous MRI showed disc degeneration without nerve impingement. Symptoms suggest worsening arthritis changes. - Prescribed Celebrex  for anti-inflammatory effect. - Prescribed Robaxin  for muscle relaxation. - Discussed potential use of prednisone  if symptoms persist. - Advised against immediate x-ray but offered to order if symptoms worsen or requested.     Reviewed x-ray and MRI reports during the office visit.  Patient will let us  know if he is not improving.  Patient had questions regarding checking cortisol levels.  Recommended he schedule physical exam with Dr. Duanne to discuss this in more detail.  Patient declined doing an x-ray at this time.  If he changes his mind he will let us  know. Return if symptoms worsen or fail to improve, for physical.   Subjective:    Patient ID: Thomas Gutierrez, male    DOB: [birth year]  Age: 59 y.o. MRN: [id number]  Chief Complaint  Patient presents with   Neck Pain    Pain at the base of right side of neck. Has been worsening for the last couple of weeks.     Neck Pain    Discussed the use of AI scribe software for clinical note transcription with the patient, who gave verbal consent to proceed.  History of Present Illness        History of Present Illness Thomas Gutierrez is a 59 year old male with neck arthritis who presents with worsening neck pain.  He has been experiencing worsening neck pain for the past two weeks, particularly when  turning his head to the left. Movement exacerbates the pain, and it has been progressively worsening. No recent trauma or specific incident triggered the current pain.  He has a history of neck arthritis with disc degeneration noted on an MRI from 2021, but no nerve impingement was observed at that time. 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 Despite the physical and repetitive nature of his work in restoration, he does not believe it has contributed to his current symptoms.  He has not been using any medications for the pain, including anti-inflammatories or muscle relaxants, as he prefers to avoid taking medications. He recalls being prescribed Celebrex  in the past for knee issues, but he has not used it recently. He also mentions having been prescribed prednisone  for an infection under his arm in the past, which he found helpful for that condition.  No arm symptoms are present. He reports difficulty sleeping due to the neck pain. No recent trauma or specific incident triggered the pain. Patient has questions about cortisol and wonders if he has cortisol issues. Has been hearing and reading lots of stuff re this. Patient has not had a physical with Dr. Duanne in a long time Physical Exam NECK: Pain on right and left rotation with crepitus on rotation.  Results Radiology Cervical spine MRI (2021): Disc degeneration without nerve root impingement  IMPRESSION: 1. No impingement or other explanation for right arm symptoms. 2. 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  Electronically Signed   By: Cassondra Roulette M.D.   On: 03/06/2020 05:16 Assessment and Plan Cervical spondylosis with chronic neck pain Chronic neck pain likely due to cervical spondylosis, exacerbated recently. Severe pain affecting sleep and daily activities. Previous MRI showed disc degeneration without nerve impingement. Symptoms suggest worsening  arthritis changes. - Prescribed Celebrex  for anti-inflammatory effect. - Prescribed Robaxin  for muscle relaxation. - Discussed potential use of prednisone  if symptoms persist. - Advised against immediate x-ray but offered to order if symptoms worsen or requested.    The ASCVD Risk score (Arnett DK, et al., 2019) failed to calculate for the following reasons:   Risk score cannot be calculated because patient has a medical history suggesting prior/existing ASCVD   * - Cholesterol units were assumed  Past Medical History:  Diagnosis Date   Vertigo    Past Surgical History:  Procedure Laterality Date   NASAL SEPTOPLASTY W/ TURBINOPLASTY Bilateral 07/21/2020   Procedure: NASAL SEPTOPLASTY WITH BILATERAL  TURBINATE REDUCTION;  Surgeon: Karis Clunes, MD;  Location: [facility];  Service: ENT;  Laterality: Bilateral;   Social History[1] Family History  Problem Relation Age of Onset   Cancer Mother        breast   Cancer Father 58       lung   Cancer Sister        breast   Allergies[2]  Review of Systems  Musculoskeletal:  Positive for neck pain.      Objective:    BP (!) 136/100   Pulse 96   Ht 6' 3 (1.905 m)   Wt 219 lb (99.3 kg)   SpO2 96%   BMI 27.37 kg/m  BP Readings from Last 3 Encounters:  01/07/25 (!) 136/100  09/03/24 132/76  08/16/24 130/82   Wt Readings from Last 3 Encounters:  01/07/25 219 lb (99.3 kg)  09/03/24 208 lb (94.3 kg)  08/16/24 208 lb 6.4 oz (94.5 kg)    Physical Exam Vitals and nursing note reviewed.  Constitutional:      General: He is not in acute distress.    Appearance: Normal appearance.     Comments: Patient does look stiff sitting on exam table.   HENT:     Head: Normocephalic.     Right Ear: Tympanic membrane and external ear normal.  Eyes:     Extraocular Movements: Extraocular movements intact.     Conjunctiva/sclera: Conjunctivae normal.     Pupils: Pupils are equal, round, and reactive to light.  Neck:      Comments: Decreased range of motion with turning his neck to the left side.  Has tenderness over the posterior neck muscles on the right side.  Patient can do forward flexion.  Patient does have tight upper trapezius muscles Cardiovascular:     Rate and Rhythm: Normal rate and regular rhythm.     Heart sounds: Normal heart sounds.  Pulmonary:     Effort: Pulmonary effort is normal.     Breath sounds: Normal breath sounds.  Musculoskeletal:     Cervical back: Rigidity and tenderness present.     Right lower leg: No edema.     Left lower leg: No edema.  Neurological:     General: No focal deficit present.     Mental Status: He is alert and oriented to person, place, and time.     Comments: Strength intact  UE  Psychiatric:        Mood and Affect: Mood normal.        Behavior: Behavior normal.      No results found for any visits on 01/07/25.          [1]  Social History Tobacco Use   Smoking status: Never   Smokeless tobacco: Current    Types: Snuff  Vaping Use   Vaping status: Never Used  Substance Use Topics   Alcohol use: No   Drug use: No  [2]  Allergies Allergen Reactions   Haldol [Haloperidol Lactate] Other (See Comments)    REACTION: Muscle tightness (tension) all over body for 6 hours.    "

## 2025-01-07 NOTE — Telephone Encounter (Signed)
 FYI Only or Action Required?: FYI only for provider: appointment scheduled on this afternoon.  Patient was last seen in primary care on 09/03/2024 by Duanne Butler DASEN, MD.  Called Nurse Triage reporting Neck Pain.  Symptoms began several years ago. Worsening the past 2 weeks  Interventions attempted: OTC medications: Tylenol .  Symptoms are: unchanged.  Triage Disposition: See PCP When Office is Open (Within 3 Days)  Patient/caregiver understands and will follow disposition?: Yes                     Reason for Triage: Patient is experiencing extreme pain in neck and having a hard time turning head.    Reason for Disposition  [1] MODERATE neck pain (e.g., interferes with normal activities) AND [2] present > 3 days  Answer Assessment - Initial Assessment Questions 1. ONSET: When did the pain begin?      2-3 years ago - last 2 weeks much worse 2. LOCATION: Where does it hurt?      Where spine meets skull on right side 3. PATTERN Does the pain come and go, or has it been constant since it started?      constant 4. SEVERITY: How bad is the pain?  (Scale 0-10; or none or slight stiffness, mild, moderate, severe)     3-4/10 looking up 7-8/10 5. RADIATION: Does the pain go anywhere else, shoot into your arms?     no 6. CORD SYMPTOMS: Any weakness or numbness of the arms or legs?     no 7. CAUSE: What do you think is causing the neck pain?     disk 8. NECK OVERUSE: Any recent activities that involved turning or twisting the neck?     no 9. OTHER SYMPTOMS: Do you have any other symptoms? (e.g., headache, fever, chest pain, difficulty breathing, neck swelling)     no  Protocols used: Neck Pain or Stiffness-A-AH

## 2025-01-18 ENCOUNTER — Ambulatory Visit: Payer: Self-pay

## 2025-01-18 ENCOUNTER — Encounter: Payer: Self-pay | Admitting: Family Medicine

## 2025-01-18 ENCOUNTER — Ambulatory Visit: Admitting: Family Medicine

## 2025-01-18 VITALS — BP 126/76 | HR 104 | Temp 98.5°F | Ht 75.0 in | Wt 219.0 lb

## 2025-01-18 DIAGNOSIS — J069 Acute upper respiratory infection, unspecified: Secondary | ICD-10-CM

## 2025-01-18 DIAGNOSIS — J3489 Other specified disorders of nose and nasal sinuses: Secondary | ICD-10-CM | POA: Diagnosis not present

## 2025-01-18 LAB — INFLUENZA A AND B AG, IMMUNOASSAY
INFLUENZA A ANTIGEN: NOT DETECTED
INFLUENZA B ANTIGEN: NOT DETECTED

## 2025-01-18 MED ORDER — AMOXICILLIN 875 MG PO TABS: 875.0000 mg | ORAL_TABLET | Freq: Two times a day (BID) | ORAL | 0 refills | Status: AC

## 2025-01-18 NOTE — Progress Notes (Signed)
 "  Subjective:    Patient ID: Thomas Gutierrez, male    DOB: 1966/11/18, 59 y.o.   MRN: [id number]   Patient states that symptoms began on Tuesday.  Symptoms began with a sore throat.  Sore throat lasted approximately 48 hours.  Subsequent he developed significant head congestion, rhinorrhea, headache and sinus pressure.  He reports cough due to postnasal drip.  He denies any body aches.  He denies any chest pain or shortness of breath.  Sore throat has resolved. Past Medical History:  Diagnosis Date   Vertigo    Past Surgical History:  Procedure Laterality Date   NASAL SEPTOPLASTY W/ TURBINOPLASTY Bilateral 07/21/2020   Procedure: NASAL SEPTOPLASTY WITH BILATERAL  TURBINATE REDUCTION;  Surgeon: Karis Clunes, MD;  Location: [facility];  Service: ENT;  Laterality: Bilateral;   Current Outpatient Medications on File Prior to Visit  Medication Sig Dispense Refill   celecoxib  (CELEBREX ) 100 MG capsule Take 1 capsule (100 mg total) by mouth 2 (two) times daily. 60 capsule 0   celecoxib  (CELEBREX ) 200 MG capsule Take 1 capsule (200 mg total) by mouth 2 (two) times daily. (Patient not taking: Reported on 08/01/2024) 60 capsule 11   HYDROcodone -acetaminophen  (NORCO/VICODIN) 5-325 MG tablet Take 1 tablet by mouth every 6 (six) hours as needed. (Patient not taking: Reported on 01/07/2025) 30 tablet 0   methocarbamol  (ROBAXIN ) 750 MG tablet Take 1 tablet (750 mg total) by mouth every 8 (eight) hours as needed for muscle spasms. 30 tablet 1   predniSONE  (DELTASONE ) 20 MG tablet 3 tabs poqday 1-2, 2 tabs poqday 3-4, 1 tab poqday 5-6 (Patient not taking: Reported on 01/07/2025) 12 tablet 0   sildenafil  (VIAGRA ) 100 MG tablet Take 0.5-1 tablets (50-100 mg total) by mouth daily as needed for erectile dysfunction. (Patient not taking: Reported on 08/01/2024) 5 tablet 3   No current facility-administered medications on file prior to visit.   Allergies  Allergen Reactions   Haldol [Haloperidol Lactate]  Other (See Comments)    REACTION: Muscle tightness (tension) all over body for 6 hours.    Social History   Socioeconomic History   Marital status: Single    Spouse name: Not on file   Number of children: 5   Years of education: Not on file   Highest education level: High school graduate  Occupational History    Comment: NA  Tobacco Use   Smoking status: Never   Smokeless tobacco: Current    Types: Snuff  Vaping Use   Vaping status: Never Used  Substance and Sexual Activity   Alcohol use: No   Drug use: No   Sexual activity: Yes  Other Topics Concern   Not on file  Social History Narrative   Lives with sig other x 27 years   Caffeine- sweet tea all day   I hike the [place], play basketball   Social Drivers of Health   Tobacco Use: High Risk (01/07/2025)   Patient History    Smoking Tobacco Use: Never    Smokeless Tobacco Use: Current    Passive Exposure: Not on file  Financial Resource Strain: Not on file  Food Insecurity: Not on file  Transportation Needs: Not on file  Physical Activity: Not on file  Stress: Not on file  Social Connections: Not on file  Intimate Partner Violence: Not on file  Depression (EYV7-0): Low Risk (08/16/2024)   Depression (PHQ2-9)    PHQ-2 Score: 0  Alcohol Screen: Not on file  Housing:  Not on file  Utilities: Not on file  Health Literacy: Not on file      Review of Systems     Objective:   Physical Exam Vitals reviewed.  Constitutional:      Appearance: Normal appearance. He is normal weight. He is not ill-appearing or toxic-appearing.  HENT:     Right Ear: Ear canal normal. A middle ear effusion is present.     Left Ear: Tympanic membrane and ear canal normal.     Nose: Congestion and rhinorrhea present.     Right Sinus: No maxillary sinus tenderness or frontal sinus tenderness.     Left Sinus: No maxillary sinus tenderness or frontal sinus tenderness.     Mouth/Throat:     Pharynx: No oropharyngeal exudate or  posterior oropharyngeal erythema.  Eyes:     Conjunctiva/sclera: Conjunctivae normal.  Cardiovascular:     Rate and Rhythm: Normal rate and regular rhythm.     Heart sounds: Normal heart sounds. No murmur heard.    No friction rub. No gallop.  Pulmonary:     Effort: Pulmonary effort is normal. No respiratory distress.     Breath sounds: Normal breath sounds. No stridor. No wheezing or rales.  Lymphadenopathy:     Cervical: No cervical adenopathy.  Neurological:     Mental Status: He is alert.          Assessment & Plan:  Sinus pain - Plan: Influenza A and B Ag, Immunoassay  Viral upper respiratory tract infection Patient's symptoms are consistent with a viral upper respiratory infection.  I will screen the patient for influenza.  Recommended symptomatic care with Tylenol  and/or ibuprofen for body aches and pain and head congestion headache.  Recommended Coricidin HBP for head congestion and rhinorrhea.  Recommended tincture of time.  At this point, I do not feel that the patient has a bacterial sinus infection.  I explained the natural history of sinus infections and recommended that he allow 1 week for symptoms to resolve "

## 2025-01-18 NOTE — Telephone Encounter (Signed)
 FYI Only or Action Required?: FYI only for provider: appointment scheduled on this afternoon.  Patient was last seen in primary care on 01/07/2025 by Aletha Bene, MD.  Called Nurse Triage reporting Facial Pain sinus pain and congestion, Fever yesterday and sore throat in the morning.  Symptoms began several days ago.  Interventions attempted: OTC medications: mucinex and tylenol .  Symptoms are: unchanged.  Triage Disposition: See Physician Within 24 Hours  Patient/caregiver understands and will follow disposition?: yes                          Reason for Triage: intense pressure in his head and sore throat.   Reason for Disposition  [1] Sinus pain (not just congestion) AND [2] fever  Answer Assessment - Initial Assessment Questions 1. LOCATION: Where does it hurt?      Mask of face 2. ONSET: When did the sinus pain start?  (e.g., hours, days)      weds 3. SEVERITY: How bad is the pain?   (Scale 0-10; or none, mild, moderate or severe)     moderate 4. RECURRENT SYMPTOM: Have you ever had sinus problems before? If Yes, ask: When was the last time? and What happened that time?      yes 5. NASAL CONGESTION: Is the nose blocked? If Yes, ask: Can you open it or must you breathe through your mouth?     Can breathe through nose 6. NASAL DISCHARGE: Do you have discharge from your nose? If so ask, What color?     clear 7. FEVER: Do you have a fever? If Yes, ask: What is it, how was it measured, and when did it start?      Thinks he had a fever yesterday 8. OTHER SYMPTOMS: Do you have any other symptoms? (e.g., sore throat, cough, earache, difficulty breathing)     Clear sinus drainage - sore throat which resolves as day progresses  Protocols used: Sinus Pain or Congestion-A-AH

## 2025-01-28 ENCOUNTER — Encounter: Admitting: Family Medicine
